# Patient Record
Sex: Male | Born: 1945
Health system: Southern US, Community
[De-identification: ages and names within clinical notes are randomized; demographics above are authoritative.]

## PROBLEM LIST (undated history)

## (undated) DIAGNOSIS — Z83719 Family history of colon polyps, unspecified: Secondary | ICD-10-CM

## (undated) DIAGNOSIS — I440 Atrioventricular block, first degree: Secondary | ICD-10-CM

## (undated) DIAGNOSIS — R7301 Impaired fasting glucose: Secondary | ICD-10-CM

## (undated) DIAGNOSIS — Z8371 Family history of colonic polyps: Secondary | ICD-10-CM

## (undated) DIAGNOSIS — E785 Hyperlipidemia, unspecified: Secondary | ICD-10-CM

## (undated) DIAGNOSIS — I10 Essential (primary) hypertension: Secondary | ICD-10-CM

## (undated) HISTORY — DX: Impaired fasting glucose: R73.01

## (undated) HISTORY — DX: Family history of colon polyps, unspecified: Z83.719

## (undated) HISTORY — DX: Atrioventricular block, first degree: I44.0

## (undated) HISTORY — DX: Family history of colonic polyps: Z83.71

## (undated) HISTORY — DX: Essential (primary) hypertension: I10

## (undated) HISTORY — DX: Hyperlipidemia, unspecified: E78.5

---

## 1998-05-14 HISTORY — PX: OTHER SURGICAL HISTORY: SHX169

## 2000-05-14 HISTORY — PX: COLONOSCOPY: SHX174

## 2008-03-02 ENCOUNTER — Ambulatory Visit: Payer: Self-pay | Admitting: Family Medicine

## 2008-03-02 DIAGNOSIS — M109 Gout, unspecified: Secondary | ICD-10-CM | POA: Insufficient documentation

## 2008-03-02 DIAGNOSIS — I10 Essential (primary) hypertension: Secondary | ICD-10-CM

## 2008-03-02 DIAGNOSIS — E785 Hyperlipidemia, unspecified: Secondary | ICD-10-CM

## 2008-03-04 ENCOUNTER — Encounter: Payer: Self-pay | Admitting: Family Medicine

## 2008-03-05 ENCOUNTER — Encounter: Payer: Self-pay | Admitting: Family Medicine

## 2008-03-05 LAB — CONVERTED CEMR LAB
Albumin: 4.7 g/dL (ref 3.5–5.2)
BUN: 12 mg/dL (ref 6–23)
CO2: 25 meq/L (ref 19–32)
Calcium: 9.5 mg/dL (ref 8.4–10.5)
Chloride: 102 meq/L (ref 96–112)
Cholesterol: 213 mg/dL — ABNORMAL HIGH (ref 0–200)
Creatinine, Ser: 0.84 mg/dL (ref 0.40–1.50)
Glucose, Bld: 116 mg/dL — ABNORMAL HIGH (ref 70–99)
HDL: 44 mg/dL (ref >39)
Potassium: 5.4 meq/L — ABNORMAL HIGH (ref 3.5–5.3)
Total CHOL/HDL Ratio: 4.8

## 2008-11-22 ENCOUNTER — Telehealth (INDEPENDENT_AMBULATORY_CARE_PROVIDER_SITE_OTHER): Payer: Self-pay | Admitting: *Deleted

## 2008-11-23 ENCOUNTER — Ambulatory Visit: Payer: Self-pay | Admitting: Family Medicine

## 2009-02-13 ENCOUNTER — Telehealth (INDEPENDENT_AMBULATORY_CARE_PROVIDER_SITE_OTHER): Payer: Self-pay | Admitting: *Deleted

## 2009-02-13 ENCOUNTER — Ambulatory Visit: Payer: Self-pay | Admitting: Family Medicine

## 2009-02-13 DIAGNOSIS — B029 Zoster without complications: Secondary | ICD-10-CM | POA: Insufficient documentation

## 2009-03-04 ENCOUNTER — Telehealth: Payer: Self-pay | Admitting: Family Medicine

## 2009-03-07 ENCOUNTER — Ambulatory Visit: Payer: Self-pay | Admitting: Family Medicine

## 2009-03-11 ENCOUNTER — Telehealth: Payer: Self-pay | Admitting: Family Medicine

## 2009-07-21 ENCOUNTER — Ambulatory Visit: Payer: Self-pay | Admitting: Family Medicine

## 2009-07-21 DIAGNOSIS — I440 Atrioventricular block, first degree: Secondary | ICD-10-CM

## 2009-07-21 DIAGNOSIS — D126 Benign neoplasm of colon, unspecified: Secondary | ICD-10-CM | POA: Insufficient documentation

## 2009-07-25 ENCOUNTER — Encounter: Payer: Self-pay | Admitting: Family Medicine

## 2009-07-27 LAB — CONVERTED CEMR LAB
ALT: 19 units/L (ref 0–53)
CO2: 27 meq/L (ref 19–32)
Calcium: 9.3 mg/dL (ref 8.4–10.5)
Chloride: 101 meq/L (ref 96–112)
Cholesterol: 188 mg/dL (ref 0–200)
Creatinine, Ser: 0.97 mg/dL (ref 0.40–1.50)
Glucose, Bld: 122 mg/dL — ABNORMAL HIGH (ref 70–99)
Total Bilirubin: 0.5 mg/dL (ref 0.3–1.2)
Total CHOL/HDL Ratio: 4.5
Total Protein: 6.9 g/dL (ref 6.0–8.3)
Triglycerides: 167 mg/dL — ABNORMAL HIGH (ref <150)
VLDL: 33 mg/dL (ref 0–40)

## 2009-08-04 ENCOUNTER — Encounter: Payer: Self-pay | Admitting: Family Medicine

## 2009-08-10 ENCOUNTER — Ambulatory Visit: Payer: Self-pay | Admitting: Family Medicine

## 2009-08-10 DIAGNOSIS — E1169 Type 2 diabetes mellitus with other specified complication: Secondary | ICD-10-CM | POA: Insufficient documentation

## 2009-08-10 DIAGNOSIS — E669 Obesity, unspecified: Secondary | ICD-10-CM

## 2009-08-10 LAB — CONVERTED CEMR LAB
Bilirubin Urine: NEGATIVE
Blood Glucose, AC Bkfst: 122 mg/dL
Blood in Urine, dipstick: NEGATIVE
Glucose, Urine, Semiquant: NEGATIVE
Protein, U semiquant: NEGATIVE
Urobilinogen, UA: 0.2
pH: 6.5

## 2009-08-24 ENCOUNTER — Ambulatory Visit: Payer: Self-pay | Admitting: Cardiology

## 2009-08-24 ENCOUNTER — Encounter: Payer: Self-pay | Admitting: Cardiology

## 2010-02-03 ENCOUNTER — Encounter (INDEPENDENT_AMBULATORY_CARE_PROVIDER_SITE_OTHER): Payer: Self-pay | Admitting: *Deleted

## 2010-06-13 ENCOUNTER — Ambulatory Visit: Admit: 2010-06-13 | Payer: Self-pay | Admitting: Family Medicine

## 2010-06-13 NOTE — Miscellaneous (Signed)
Summary: colonoscopy: polyp/ diverticulosis  Clinical Lists Changes  Observations: Added new observation of COLONRECACT: Further recommendations pending biopsy results.  Repeat colonoscopy in 5 years.  (08/04/2009 13:19) Added new observation of COLONOSCOPY: Location:  [facility].    Polyp in the sigmoid colon  diverticulosis, diffuse otherwise normal to terminal illeum  (08/04/2009 13:19)      Colonoscopy  Procedure date:  08/04/2009  Findings:      Location:  [facility].    Polyp in the sigmoid colon  diverticulosis, diffuse otherwise normal to terminal illeum   Comments:      Further recommendations pending biopsy results.  Repeat colonoscopy in 5 years.    Colonoscopy  Procedure date:  08/04/2009  Findings:      Location:  [facility].    Polyp in the sigmoid colon  diverticulosis, diffuse otherwise normal to terminal illeum   Comments:      Further recommendations pending biopsy results.  Repeat colonoscopy in 5 years.

## 2010-06-13 NOTE — Assessment & Plan Note (Signed)
Summary: fasting sugar and UA//mpm  Nurse Visit   Vitals Entered By: Payton Spark CMA (August 10, 2009 8:36 AM)  Allergies: 1)  ! Allopurinol (Allopurinol) Laboratory Results   Urine Tests    Routine Urinalysis   Color: yellow Appearance: Clear Glucose: negative   (Normal Range: Negative) Bilirubin: negative   (Normal Range: Negative) Ketone: negative   (Normal Range: Negative) Spec. Gravity: 1.015   (Normal Range: 1.003-1.035) Blood: negative   (Normal Range: Negative) pH: 6.5   (Normal Range: 5.0-8.0) Protein: negative   (Normal Range: Negative) Urobilinogen: 0.2   (Normal Range: 0-1) Nitrite: negative   (Normal Range: Negative) Leukocyte Esterace: trace   (Normal Range: Negative)     Blood Tests     CBG Fasting:: 122mg /dL     Orders Added: 1)  Glucose, (CBG) [82962] 2)  UA Dipstick w/o Micro (automated)  [81003]

## 2010-06-13 NOTE — Letter (Signed)
Summary: Letter Regarding Path Results/[facility]  Letter Regarding Path Results/[facility]   Imported By: Lanelle Bal 08/18/2009 11:25:45  _____________________________________________________________________  External Attachment:    Type:   Image     Comment:   External Document

## 2010-06-13 NOTE — Assessment & Plan Note (Signed)
Summary: Welcome to Medicare   Vital Signs:  Patient profile:   65 year old male Height:      71.75 inches Weight:      240 pounds BMI:     32.90 O2 Sat:      99 % on Room air Pulse rate:   69 / minute BP sitting:   138 / 87  (left arm) Cuff size:   large  Vitals Entered By: Payton Spark CMA (July 21, 2009 1:23 PM)  O2 Flow:  Room air CC: Welcome to Medicare CPE  Vision Screening:Left eye with correction: 20 / 70 Right eye with correction: 20 / 20 Both eyes with correction: 20 / 20        Vision Entered By: Payton Spark CMA (July 21, 2009 2:25 PM)   Primary Care Provider:  Seymour Bars DO  CC:  Welcome to Medicare CPE.  History of Present Illness: 65 yo WM presents for Welcome to Medicare Physical.    Medicare Initial Preventive Physical Examination Encounter form completed along with EKG. See scanned document.  Current Medications (verified): 1)  Lisinopril 40 Mg Tabs (Lisinopril) .Marland Kitchen.. 1 Tab By Mouth Once Daily 2)  Simvastatin 20 Mg Tabs (Simvastatin) .... Take 1 Tablet By Mouth Once A Day 3)  Hydrochlorothiazide 25 Mg Tabs (Hydrochlorothiazide) .Marland Kitchen.. 1 Tab By Mouth Daily  Allergies (verified): 1)  ! Allopurinol (Allopurinol)  Past History:  Past Medical History: gout HTN dyslipidemia IFG  colonoscopy 2002, polyps  Past Surgical History: Reviewed history from 03/02/2008 and no changes required. tophi removed foot 2000  Family History: Reviewed history from 03/02/2008 and no changes required. 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 Quit smoking in 1990's after 20 pack year hx. 2 ETOH/ wk No regular exercise.  Review of Systems  The patient denies anorexia, fever, weight loss, weight gain, vision loss, decreased hearing, hoarseness, chest pain, syncope, dyspnea on  exertion, peripheral edema, prolonged cough, headaches, hemoptysis, abdominal pain, melena, hematochezia, severe indigestion/heartburn, hematuria, incontinence, genital sores, muscle weakness, suspicious skin lesions, transient blindness, difficulty walking, depression, unusual weight change, abnormal bleeding, enlarged lymph nodes, angioedema, breast masses, and testicular masses.    Physical Exam  General:  alert, well-developed, well-nourished, and well-hydrated.   Head:  normocephalic and atraumatic.   Eyes:  pupils equal, pupils round, and pupils reactive to light.   Ears:  EACs patent; TMs translucent and gray with good cone of light and bony landmarks.  Nose:  no nasal discharge.   Mouth:  good dentition and pharynx pink and moist.   Neck:  no masses.  no audible carotid bruits Lungs:  Normal respiratory effort, chest expands symmetrically. Lungs are clear to auscultation, no crackles or wheezes. Heart:  Normal rate and regular rhythm. S1 and S2 normal without gallop, murmur, click, rub or other extra sounds. Abdomen:  soft, non-tender, normal bowel sounds, no distention, no masses, no guarding, no hepatomegaly, and no splenomegaly.  no audible aortic bruits Rectal:  No external abnormalities noted. Normal sphincter tone. No rectal masses or tenderness. hemoccult neg Prostate:  Prostate gland firm and smooth, no enlargement, nodularity, tenderness, mass, asymmetry or induration. Msk:  No deformity or scoliosis noted of thoracic or lumbar spine.   Pulses:  R and L carotid,radial,femoral,dorsalis pedis and posterior tibial pulses are full and equal bilaterally Extremities:  No clubbing, cyanosis, edema,  or deformity noted with normal full range of motion of all joints.   Neurologic:  alert & oriented X3 and gait normal.   Skin:  color normal and no rashes.   Cervical Nodes:  No lymphadenopathy noted Psych:  good eye contact, not anxious appearing, and not depressed appearing.      Impression & Recommendations:  Problem # 1:  HEALTH MAINTENANCE EXAM (ICD-V70.0)  Welcome to Medicare Physical done. Form completed. EKG done showing a 1st deg AV block with PR interval of 216 msec and incomplete L BBB. Refer to cardiology for further evaluation. RFd meds. Update fasting labs. Pneumovax given today. GI referral for screening colonoscopy.  Orders: First annual wellness visit with prevention plan  [phone])  Complete Medication List: 1)  Lisinopril 40 Mg Tabs (Lisinopril) .Marland Kitchen.. 1 tab by mouth once daily 2)  Simvastatin 20 Mg Tabs (Simvastatin) .... Take 1 tablet by mouth once a day 3)  Hydrochlorothiazide 25 Mg Tabs (Hydrochlorothiazide) .Marland Kitchen.. 1 tab by mouth daily 4)  Aspir-low 81 Mg Tbec (Aspirin) .Marland Kitchen.. 1 tab by mouth daily  Other Orders: Cardiology Referral (Cardiology) T-PSA Total (Medicare Screen Only) [phone]) T-Comprehensive Metabolic Panel [phone]) T-Lipid Profile [phone]) Prescription Created Electronically [phone]) EKG w/ Interpretation (93000) Gastroenterology Referral (GI) Pneumococcal Vaccine (28413) Admin 1st Vaccine (24401) Admin 1st Vaccine [facility]) [phone])  Patient Instructions: 1)  Pneumovax today. 2)  Will set you up with [facility] cardiology downstairs for abnormal EKG. 3)  Meds RFd for 90 day supply. 4)  Have fasting labs done. 5)  Will call you w/ results. 6)  Will set up your screening colonoscopy at [facility]. 7)  Return for f/u HTN in 6 mos. Prescriptions: HYDROCHLOROTHIAZIDE 25 MG TABS (HYDROCHLOROTHIAZIDE) 1 tab by mouth daily  #90 x 3   Entered and Authorized by:   Seymour Bars DO   Signed by:   Seymour Bars DO on 07/21/2009   Method used:   Electronically to        [facility] [address] [place].* (retail)       [address] [place].       [place], [place]  [address]       Ph: [phone]       Fax: [phone]   RxID:   [id number] SIMVASTATIN 20 MG TABS (SIMVASTATIN) Take 1 tablet by mouth  once a day  #90 x 3   Entered and Authorized by:   Seymour Bars DO   Signed by:   Seymour Bars DO on 07/21/2009   Method used:   Electronically to        [facility] [address] [place].* (retail)       [address] [place].       [place], [place]  [address]       Ph: [phone]       Fax: [phone]   RxID:   [id number] LISINOPRIL 40 MG TABS (LISINOPRIL) 1 tab by mouth once daily  #90 x 3   Entered and Authorized by:   Seymour Bars DO   Signed by:   Seymour Bars DO on 07/21/2009   Method used:   Electronically to        [facility] [address] [place].* (retail)       [address] [place].       [place], [place]  [address]       Ph: [phone]       Fax: [phone]   RxID:   [id number]    Tetanus/Td Immunization History:  Tetanus/Td # 1:  Historical (05/14/2005)  Pneumovax Vaccine    Vaccine Type: Pneumovax (Medicare)    Site: left deltoid    Dose: 0.5 ml    Route: IM    Given by: Payton Spark CMA    Exp. Date: 06/08/2010    Lot #: [id number]    VIS given: 12/10/95 version given July 21, 2009.

## 2010-06-13 NOTE — Miscellaneous (Signed)
Summary: Immunization Entry   Immunization History:  Influenza Immunization History:    Influenza:  historical (02/02/2010) 

## 2010-06-13 NOTE — Assessment & Plan Note (Signed)
Summary: [facility]   Visit Type:  Initial Consult Primary Provider:  Seymour Bars DO  CC:  Av block first degree.  History of Present Illness: 65 year old male for evaluation of abnormal electrocardiogram. The patient was recently seen for a Medicare physical and was noted to have a first degree AV block on his electrocardiogram. Cardiology is therefore asked to further evaluate. He denies any dyspnea on exertion, orthopnea, PND, pedal edema, palpitations, syncope, presyncope or chest pain. There is no history of claudication. Note he exercises routinely. He walks or runs on a treadmill for 15 minutes prior to lifting weights and then 15 minutes afterwards. He has no symptoms with this.  Current Medications (verified): 1)  Lisinopril 40 Mg Tabs (Lisinopril) .Marland Kitchen.. 1 Tab By Mouth Once Daily 2)  Simvastatin 20 Mg Tabs (Simvastatin) .... Take 1 Tablet By Mouth Once A Day 3)  Hydrochlorothiazide 25 Mg Tabs (Hydrochlorothiazide) .Marland Kitchen.. 1 Tab By Mouth Daily 4)  Aspir-Low 81 Mg Tbec (Aspirin) .Marland Kitchen.. 1 Tab By Mouth Daily  Allergies: 1)  ! Allopurinol (Allopurinol)  Past History:  Past Medical History: AV BLOCK, 1ST DEGREE (ICD-426.11) ESSENTIAL HYPERTENSION, BENIGN (ICD-401.1) IMPAIRED FASTING GLUCOSE (ICD-790.21) COLONIC POLYPS (ICD-211.3) HERPES ZOSTER (ICD-053.9) DYSLIPIDEMIA (ICD-272.4) GOUT (ICD-274.9)  Past Surgical History: tophi removed foot 2000 colonoscopy 2002, polyps  Family History: Reviewed history from 03/02/2008 and no changes required. 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 Quit smoking in 1998 after 20 pack year hx. 2 ETOH night No regular exercise.  Review of Systems       no fevers or chills, productive cough, hemoptysis, dysphasia, odynophagia, melena,  hematochezia, dysuria, hematuria, rash, seizure activity, orthopnea, PND, pedal edema, claudication. Remaining systems are negative.   Vital Signs:  Patient profile:   65 year old male Height:      71.75 inches Weight:      237.25 pounds BMI:     32.52 Pulse rate:   63 / minute Pulse rhythm:   regular Resp:     18 per minute BP sitting:   138 / 85  (left arm) Cuff size:   large  Vitals Entered By: Vikki Ports (August 24, 2009 4:01 PM)  Physical Exam  General:  Well developed/well nourished in NAD Skin warm/dry Patient not depressed No peripheral clubbing Back-normal HEENT-normal/normal eyelids Neck supple/normal carotid upstroke bilaterally; no bruits; no JVD; no thyromegaly chest - CTA/ normal expansion CV - RRR/normal S1 and S2; no murmurs, rubs or gallops;  PMI nondisplaced Abdomen -NT/ND, no HSM, no mass, + bowel sounds, no bruit 2+ femoral pulses, no bruits Ext-no edema, chords, 2+ DP Neuro-grossly nonfocal     EKG  Procedure date:  07/21/2009  Findings:      Normal sinus rhythm and, first-degree AV block.   Impression & Recommendations:  Problem # 1:  AV BLOCK, 1ST DEGREE (ICD-426.11) Patient is noted to have an abnormal electrocardiogram with a first degree AV block. He is having no cardiac symptoms including no chest pain, shortness of breath or history of syncope. I do not think further cardiac evaluation is indicated. I have instructed him to contact us if he develops symptoms of dizziness or syncope in the future. His updated medication list for this problem includes:    Lisinopril 40 Mg Tabs (Lisinopril) .Marland Kitchen... 1 tab by mouth once daily    Aspir-low  81 Mg Tbec (Aspirin) .Marland Kitchen... 1 tab by mouth daily  Problem # 2:  ESSENTIAL HYPERTENSION, BENIGN (ICD-401.1) His blood pressure is controlled on present medications. Will continue. His updated medication list for this problem includes:    Lisinopril 40 Mg Tabs (Lisinopril) .Marland Kitchen... 1 tab by mouth once daily     Hydrochlorothiazide 25 Mg Tabs (Hydrochlorothiazide) .Marland Kitchen... 1 tab by mouth daily    Aspir-low 81 Mg Tbec (Aspirin) .Marland Kitchen... 1 tab by mouth daily  Problem # 3:  IMPAIRED FASTING GLUCOSE (ICD-790.21) Management per primary care.  Problem # 4:  DYSLIPIDEMIA (ICD-272.4) Continue statin. Lipids and liver monitored by primary care. His updated medication list for this problem includes:    Simvastatin 20 Mg Tabs (Simvastatin) .Marland Kitchen... Take 1 tablet by mouth once a day

## 2010-06-13 NOTE — Letter (Signed)
Summary: Physical Exam Form/[facility]  Physical Exam Form/[facility]   Imported By: Lanelle Bal 07/27/2009 11:41:29  _____________________________________________________________________  External Attachment:    Type:   Image     Comment:   External Document

## 2010-06-29 ENCOUNTER — Ambulatory Visit (INDEPENDENT_AMBULATORY_CARE_PROVIDER_SITE_OTHER): Payer: Self-pay | Admitting: Family Medicine

## 2010-06-29 ENCOUNTER — Encounter: Payer: Self-pay | Admitting: Family Medicine

## 2010-06-29 DIAGNOSIS — I440 Atrioventricular block, first degree: Secondary | ICD-10-CM

## 2010-06-29 DIAGNOSIS — I10 Essential (primary) hypertension: Secondary | ICD-10-CM

## 2010-06-29 DIAGNOSIS — R7301 Impaired fasting glucose: Secondary | ICD-10-CM

## 2010-06-29 DIAGNOSIS — E785 Hyperlipidemia, unspecified: Secondary | ICD-10-CM

## 2010-07-05 NOTE — Assessment & Plan Note (Signed)
Summary: f/u HTN   Vital Signs:  Patient profile:   65 year old male Height:      71.75 inches Weight:      240 pounds BMI:     32.90 O2 Sat:      96 % on Room air Pulse rate:   86 / minute BP sitting:   132 / 80  (left arm) Cuff size:   large  Vitals Entered By: Payton Spark CMA (June 29, 2010 8:18 AM)  O2 Flow:  Room air CC: F/U.    Primary Care Provider:  Seymour Bars DO  CC:  F/U. Marland Kitchen  History of Present Illness: 65 yo WM presents for f/u HTN and hyperlipidemia.  He was last seen March 2011 for his welcome to Medicare Physical and had all of his fasting labs done.  He is doing well on Lisinopril, HCTZ and Simvastatin.  Denies CP or DOE or heart palpitations or swelling.  He saw Dr Jens Som for his 1st degree AV block but since he has been asymptomatic, has just been told to watch.    Weight has been stable. He does have IFG.  Has not made any changes to diet or exercise.      Current Medications (verified): 1)  Lisinopril 40 Mg Tabs (Lisinopril) .Marland Kitchen.. 1 Tab By Mouth Once Daily 2)  Simvastatin 20 Mg Tabs (Simvastatin) .... Take 1 Tablet By Mouth Once A Day 3)  Hydrochlorothiazide 25 Mg Tabs (Hydrochlorothiazide) .Marland Kitchen.. 1 Tab By Mouth Daily 4)  Aspir-Low 81 Mg Tbec (Aspirin) .Marland Kitchen.. 1 Tab By Mouth Daily  Allergies (verified): 1)  ! Allopurinol (Allopurinol)  Past History:  Past Medical History: Reviewed history from 08/24/2009 and no changes required. AV BLOCK, 1ST DEGREE (ICD-426.11) ESSENTIAL HYPERTENSION, BENIGN (ICD-401.1) IMPAIRED FASTING GLUCOSE (ICD-790.21) COLONIC POLYPS (ICD-211.3) HERPES ZOSTER (ICD-053.9) DYSLIPIDEMIA (ICD-272.4) GOUT (ICD-274.9)  Past Surgical History: Reviewed history from 08/24/2009 and no changes required. 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 2 ETOH night No regular exercise.  Review of Systems      See HPI  Physical Exam  General:  alert, well-developed, well-nourished, and well-hydrated.   Head:  normocephalic and atraumatic.   Eyes:  pupils equal, pupils round, and pupils reactive to light.   Mouth:  pharynx pink and moist.   Lungs:  normal respiratory effort, no intercostal retractions, and no accessory muscle use.   Heart:  normal rate, regular rhythm, and no murmur.   Extremities:  no LE edema Skin:  color normal.   Psych:  good eye contact, not anxious appearing, and not depressed appearing.     Impression & Recommendations:  Problem # 1:  ESSENTIAL HYPERTENSION, BENIGN (ICD-401.1) BP at goal.  Continue current meds and RFd.  Fasting labs are UTD. His updated medication list for this problem includes:    Lisinopril 40 Mg Tabs (Lisinopril) .Marland Kitchen... 1 tab by mouth once daily    Hydrochlorothiazide 25 Mg Tabs (Hydrochlorothiazide) .Marland Kitchen... 1 tab by mouth daily  BP today: 132/80 Prior BP: 138/85 (08/24/2009)  Labs Reviewed: K+: 4.4 (07/25/2009) Creat: : 0.97 (07/25/2009)   Chol: 188 (07/25/2009)   HDL: 42 (07/25/2009)   LDL: 113 (07/25/2009)   TG: 167 (07/25/2009)  Problem # 2:  IMPAIRED FASTING GLUCOSE (ICD-790.21) Fasting sugar 122-->109 today.  He is working on diet and exercise.   Orders: Fingerstick [phone])  Glucose, (CBG) [phone])  Labs Reviewed: Creat: 0.97 (07/25/2009)     Problem # 3:  DYSLIPIDEMIA (ICD-272.4) Continue current meds. His updated medication list for this problem includes:    Simvastatin 20 Mg Tabs (Simvastatin) .Marland Kitchen... Take 1 tablet by mouth once a day  Labs Reviewed: SGOT: 18 (07/25/2009)   SGPT: 19 (07/25/2009)   HDL:42 (07/25/2009), 44 (03/04/2008)  LDL:113 (07/25/2009), 94 (98/03/9146)  Chol:188 (07/25/2009), 213 (03/04/2008)  Trig:167 (07/25/2009), 373 (03/04/2008)  Problem # 4:  AV BLOCK, 1ST DEGREE (ICD-426.11) Saw Cards in 2011, asymptomatic.  No intervention  needed.  Complete Medication List: 1)  Lisinopril 40 Mg Tabs (Lisinopril) .Marland Kitchen.. 1 tab by mouth once daily 2)  Simvastatin 20 Mg Tabs (Simvastatin) .... Take 1 tablet by mouth once a day 3)  Hydrochlorothiazide 25 Mg Tabs (Hydrochlorothiazide) .Marland Kitchen.. 1 tab by mouth daily 4)  Aspir-low 81 Mg Tbec (Aspirin) .Marland Kitchen.. 1 tab by mouth daily  Patient Instructions: 1)  Return for a PHYSICAL in 6 mos. 2)  Fasting sugar 109 < -- 122 Prescriptions: HYDROCHLOROTHIAZIDE 25 MG TABS (HYDROCHLOROTHIAZIDE) 1 tab by mouth daily  #90 x 3   Entered and Authorized by:   Seymour Bars DO   Signed by:   Seymour Bars DO on 06/29/2010   Method used:   Electronically to        [facility] [address] [place].* (retail)       [address] [place].       [place], [place]  [address]       Ph: [phone]       Fax: [phone]   RxID:   [id number] SIMVASTATIN 20 MG TABS (SIMVASTATIN) Take 1 tablet by mouth once a day  #90 Tablet x 3   Entered and Authorized by:   Seymour Bars DO   Signed by:   Seymour Bars DO on 06/29/2010   Method used:   Electronically to        [facility] [address] [place].* (retail)       [address] [place].       [place], [place]  [address]       Ph: [phone]       Fax: [phone]   RxID:   [id number] LISINOPRIL 40 MG TABS (LISINOPRIL) 1 tab by mouth once daily  #90 x 3   Entered and Authorized by:   Seymour Bars DO   Signed by:   Seymour Bars DO on 06/29/2010   Method used:   Electronically to        [facility] [address] [place].* (retail)       [address] [place].       [place], [place]  [address]       Ph: [phone]       Fax: [phone]   RxID:   [id number]    Orders Added: 1)  Fingerstick [36416] 2)  Glucose, (CBG) [82962] 3)  Est. Patient Level IV [46270]    Laboratory Results   Blood Tests     CBG Fasting:: 109mg /dL

## 2010-12-26 ENCOUNTER — Encounter: Payer: Self-pay | Admitting: Cardiology

## 2011-08-16 ENCOUNTER — Encounter: Payer: Self-pay | Admitting: Family Medicine

## 2011-08-16 ENCOUNTER — Ambulatory Visit (INDEPENDENT_AMBULATORY_CARE_PROVIDER_SITE_OTHER): Payer: Medicare Other | Admitting: Family Medicine

## 2011-08-16 VITALS — BP 139/80 | HR 93 | Wt 245.0 lb

## 2011-08-16 DIAGNOSIS — Z9181 History of falling: Secondary | ICD-10-CM

## 2011-08-16 DIAGNOSIS — E785 Hyperlipidemia, unspecified: Secondary | ICD-10-CM

## 2011-08-16 DIAGNOSIS — I10 Essential (primary) hypertension: Secondary | ICD-10-CM

## 2011-08-16 DIAGNOSIS — E669 Obesity, unspecified: Secondary | ICD-10-CM

## 2011-08-16 DIAGNOSIS — Z1331 Encounter for screening for depression: Secondary | ICD-10-CM

## 2011-08-16 MED ORDER — AMBULATORY NON FORMULARY MEDICATION: Status: DC

## 2011-08-16 MED ORDER — LISINOPRIL 40 MG PO TABS: 40.0000 mg | ORAL_TABLET | Freq: Every day | ORAL | Status: DC

## 2011-08-16 MED ORDER — HYDROCHLOROTHIAZIDE 25 MG PO TABS: 25.0000 mg | ORAL_TABLET | Freq: Every day | ORAL | Status: DC

## 2011-08-16 MED ORDER — SIMVASTATIN 20 MG PO TABS: 20.0000 mg | ORAL_TABLET | Freq: Every day | ORAL | Status: DC

## 2011-08-16 NOTE — Patient Instructions (Signed)

## 2011-08-16 NOTE — Progress Notes (Signed)
  Subjective:    Patient ID: Aaron Horn, male    DOB: [birth year], 66 y.o.   MRN: [id number]  Hypertension This is a chronic problem. The current episode started more than 1 year ago. The problem is controlled. Associated symptoms include headaches. Pertinent negatives include no chest pain, palpitations, peripheral edema or shortness of breath. There are no associated agents to hypertension. Risk factors for coronary artery disease include male gender, sedentary lifestyle and obesity. Past treatments include ACE inhibitors. Compliance problems include exercise.   Hyperlipidemia This is a chronic problem. The current episode started more than 1 year ago. The problem is controlled. Exacerbating diseases include obesity. Pertinent negatives include no chest pain or shortness of breath. Current antihyperlipidemic treatment includes statins. Compliance problems include adherence to exercise.       Review of Systems  Respiratory: Negative for shortness of breath.   Cardiovascular: Negative for chest pain and palpitations.  Neurological: Positive for headaches.       Objective:   Physical Exam  Constitutional: He is oriented to person, place, and time. He appears well-developed and well-nourished.  HENT:  Head: Normocephalic and atraumatic.  Cardiovascular: Normal rate, regular rhythm and normal heart sounds.        No carotid bruits.   Pulmonary/Chest: Effort normal and breath sounds normal.  Musculoskeletal: He exhibits no edema.  Neurological: He is alert and oriented to person, place, and time.  Skin: Skin is warm and dry.  Psychiatric: He has a normal mood and affect. His behavior is normal.          Assessment & Plan:  HTN- well controlled. He says it usually runs a little bit lower on closer to 1:30. Doing well. Follow up in 6 months. He is due for CMP and lipid panel. Lab slip given today. Next  Hyperlipidemia-refill his medication today. We'll recheck his labs to make  sure he is at goal as it has been over a year.  We discussed the importance of getting the shingles vaccine. He is alert he has shingles as if he is interested in getting the vaccine. Prescription given.  Obesity-we discussed the importance of getting back into regular exercise routine and working on his weight. He agrees.  Fall risk screen-Score of 4, which is low risk.   Depression screening PHQ-9 score of 0. Neg for depression.

## 2011-08-17 LAB — COMPLETE METABOLIC PANEL WITH GFR
ALT: 24 U/L (ref 0–53)
AST: 17 U/L (ref 0–37)
Albumin: 4.5 g/dL (ref 3.5–5.2)
Alkaline Phosphatase: 48 U/L (ref 39–117)
BUN: 13 mg/dL (ref 6–23)
Potassium: 4.9 mEq/L (ref 3.5–5.3)
Sodium: 141 mEq/L (ref 135–145)

## 2011-08-17 LAB — LIPID PANEL
Cholesterol: 185 mg/dL (ref 0–200)
Total CHOL/HDL Ratio: 3.6 Ratio
VLDL: 41 mg/dL — ABNORMAL HIGH (ref 0–40)

## 2012-02-06 ENCOUNTER — Ambulatory Visit (INDEPENDENT_AMBULATORY_CARE_PROVIDER_SITE_OTHER): Payer: Medicare Other | Admitting: Family Medicine

## 2012-02-06 ENCOUNTER — Encounter: Payer: Self-pay | Admitting: Family Medicine

## 2012-02-06 VITALS — BP 125/82 | HR 84 | Wt 248.0 lb

## 2012-02-06 DIAGNOSIS — R7309 Other abnormal glucose: Secondary | ICD-10-CM

## 2012-02-06 DIAGNOSIS — I10 Essential (primary) hypertension: Secondary | ICD-10-CM

## 2012-02-06 DIAGNOSIS — E785 Hyperlipidemia, unspecified: Secondary | ICD-10-CM

## 2012-02-06 DIAGNOSIS — L219 Seborrheic dermatitis, unspecified: Secondary | ICD-10-CM

## 2012-02-06 DIAGNOSIS — Z23 Encounter for immunization: Secondary | ICD-10-CM

## 2012-02-06 DIAGNOSIS — L821 Other seborrheic keratosis: Secondary | ICD-10-CM

## 2012-02-06 LAB — BASIC METABOLIC PANEL WITH GFR
BUN: 15 mg/dL (ref 6–23)
Creat: 0.9 mg/dL (ref 0.50–1.35)
GFR, Est African American: >89 mL/min
GFR, Est Non African American: 88 mL/min
Potassium: 4.9 mEq/L (ref 3.5–5.3)

## 2012-02-06 LAB — HEMOGLOBIN A1C: Mean Plasma Glucose: 134 mg/dL — ABNORMAL HIGH (ref <117)

## 2012-02-06 NOTE — Progress Notes (Signed)
  Subjective:    Patient ID: Aaron Horn, male    DOB: [birth year], 66 y.o.   MRN: [id number]  HPI Here for six-month followup for blood pressure and high cholesterol. We did do blood work. Triglyerides are still elevated.  HTN - No CP or SOB.  Taking meds without difficulty. No headaches or dizziness or palpitations.  Hyperlipidemia - Taking 3 fish oil a day. (480 a piece).  Tolerating meds well with no SE.    He has several moles that he would like me to take a look at today to see if he feels the pain to refer him to a dermatologist. He says they occasionally get a little irritated but otherwise are not very bothersome. They have been there for several years.   Review of Systems     Objective:   Physical Exam  Constitutional: He is oriented to person, place, and time. He appears well-developed and well-nourished.  HENT:  Head: Normocephalic and atraumatic.  Cardiovascular: Normal rate, regular rhythm and normal heart sounds.   Pulmonary/Chest: Effort normal and breath sounds normal.  Neurological: He is alert and oriented to person, place, and time.  Skin: Skin is warm and dry.  Psychiatric: He has a normal mood and affect. His behavior is normal.          Assessment & Plan:  Hypertension-well-controlled today. Continue current regimen. Followup in 6 months.  Hyperlipidemia-need to repeat triglycerides and elevated in April. He is taking 3 for official tablets daily so hopefully this will help.  Abnormal glucose - we'll check a hemoglobin A1c since his last 2 glucose checks were abnormal. He has no prior history diabetes but says that his sugars have been borderline for years. Continue work on diet and exercise. He admits is not effective if it could be.  Seborrheic keratoses - discussed treatment options. These are very itchy and are benign conditions. Patient opted have cryotherapy to remove them. Call if having any problems are not healing well.  Cryotherapy  Procedure Note  Pre-operative Diagnosis: Seborrheic keratosis  Post-operative Diagnosis: same  Locations: Upper back, right side, right thigh and anterior neck   Indications: irritation  Anesthesia: not required   Procedure Details  History of allergy to iodine: no. Pacemaker? no.  Patient informed of risks (permanent scarring, infection, light or dark discoloration, bleeding, infection, weakness, numbness and recurrence of the lesion) and benefits of the procedure and verbal informed consent obtained.  The areas are treated with liquid nitrogen therapy, frozen until ice ball extended 1 mm beyond lesion, allowed to thaw, and treated again. The patient tolerated procedure well.  The patient was instructed on post-op care, warned that there may be blister formation, redness and pain. Recommend OTC analgesia as needed for pain.  Condition: Stable  Complications: none.  Plan: 1. Instructed to keep the area dry and covered for 24-48h and clean thereafter. 2. Warning signs of infection were reviewed.   3. Recommended that the patient use OTC acetaminophen as needed for pain.  4. Return prn. Call if any problems with wound healing.

## 2012-02-15 ENCOUNTER — Other Ambulatory Visit: Payer: Self-pay | Admitting: Family Medicine

## 2012-03-07 ENCOUNTER — Telehealth: Payer: Self-pay | Admitting: Physician Assistant

## 2012-03-07 NOTE — Telephone Encounter (Signed)
Pt.notified

## 2012-03-07 NOTE — Telephone Encounter (Signed)
Call pt: Let him know we don't have any documentation of regular eye exam. Recommend yearly screening at his age for glaucoma/cataracts and other eye conditions.

## 2012-08-12 ENCOUNTER — Ambulatory Visit (INDEPENDENT_AMBULATORY_CARE_PROVIDER_SITE_OTHER): Payer: Medicare Other | Admitting: Family Medicine

## 2012-08-12 ENCOUNTER — Encounter: Payer: Self-pay | Admitting: Family Medicine

## 2012-08-12 ENCOUNTER — Other Ambulatory Visit: Payer: Self-pay | Admitting: Family Medicine

## 2012-08-12 VITALS — BP 133/86 | HR 83 | Ht 71.75 in | Wt 250.0 lb

## 2012-08-12 DIAGNOSIS — R0683 Snoring: Secondary | ICD-10-CM

## 2012-08-12 DIAGNOSIS — E785 Hyperlipidemia, unspecified: Secondary | ICD-10-CM

## 2012-08-12 DIAGNOSIS — R0609 Other forms of dyspnea: Secondary | ICD-10-CM

## 2012-08-12 DIAGNOSIS — I10 Essential (primary) hypertension: Secondary | ICD-10-CM

## 2012-08-12 DIAGNOSIS — R7301 Impaired fasting glucose: Secondary | ICD-10-CM

## 2012-08-12 DIAGNOSIS — Z6834 Body mass index (BMI) 34.0-34.9, adult: Secondary | ICD-10-CM

## 2012-08-12 DIAGNOSIS — L821 Other seborrheic keratosis: Secondary | ICD-10-CM

## 2012-08-12 LAB — COMPLETE METABOLIC PANEL WITH GFR
AST: 28 U/L (ref 0–37)
Albumin: 4.8 g/dL (ref 3.5–5.2)
BUN: 12 mg/dL (ref 6–23)
Calcium: 9.5 mg/dL (ref 8.4–10.5)
Chloride: 101 mEq/L (ref 96–112)
Creat: 0.97 mg/dL (ref 0.50–1.35)
GFR, Est Non African American: 80 mL/min
Glucose, Bld: 143 mg/dL — ABNORMAL HIGH (ref 70–99)
Potassium: 4.5 mEq/L (ref 3.5–5.3)

## 2012-08-12 LAB — LIPID PANEL
Cholesterol: 205 mg/dL — ABNORMAL HIGH (ref 0–200)
Triglycerides: 222 mg/dL — ABNORMAL HIGH (ref <150)
VLDL: 44 mg/dL — ABNORMAL HIGH (ref 0–40)

## 2012-08-12 MED ORDER — LISINOPRIL 40 MG PO TABS: ORAL_TABLET | ORAL | Status: DC

## 2012-08-12 MED ORDER — HYDROCHLOROTHIAZIDE 25 MG PO TABS: 25.0000 mg | ORAL_TABLET | Freq: Every day | ORAL | Status: DC

## 2012-08-12 MED ORDER — SIMVASTATIN 40 MG PO TABS: 40.0000 mg | ORAL_TABLET | Freq: Every day | ORAL | Status: DC

## 2012-08-12 MED ORDER — SIMVASTATIN 20 MG PO TABS: ORAL_TABLET | ORAL | Status: DC

## 2012-08-12 NOTE — Progress Notes (Signed)
Subjective:    Patient ID: Aaron Horn, male    DOB: [birth year], 67 y.o.   MRN: [id number]  HPI HTN -  Pt denies chest pain, SOB, dizziness, or heart palpitations.  Taking meds as directed w/o problems.  Denies medication side effects.    IFG -  last hemoglobin A1c was 6.3 in September. He really doesn't eat a lot of concentrated sweets. No regular exercise.  Hyperlipidemia -  Tolerating his statin well. He does occasionally miss a dose. No myalgias or side effects.  Nose bleeds.  - Started recenlty.  So slowed down on taking his ASA daily.  Says had nosebleeds as a kid.  Worse on the right side. No scabs in the nose that area.  No hx of nasal polyps.   He has several more seborrheic keratoses on his back and one on his left forehead and neck that he would like me to freeze today.   Review of Systems     Objective:   Physical Exam  Constitutional: He is oriented to person, place, and time. He appears well-developed and well-nourished.  HENT:  Head: Normocephalic and atraumatic.  Cardiovascular: Normal rate, regular rhythm and normal heart sounds.   No carotid bruits.   Pulmonary/Chest: Effort normal and breath sounds normal.  Musculoskeletal: He exhibits no edema.  Neurological: He is alert and oriented to person, place, and time.  Skin: Skin is warm and dry.  Psychiatric: He has a normal mood and affect. His behavior is normal.          Assessment & Plan:  HTN- Well controlled. Continue current regimen. Followup in 3 months.  IFG - Now in the diabetic range. Discussed getting back on track with diet, exericse and weight loss. Offered to send to diabetic and nutrition classes. Really work on cutting back on carbohydrates since he does a good job on low sugar foods. Followup in 3 months for repeat hemoglobin A1c.  Lab Results  Component Value Date   HGBA1C 6.5 08/12/2012    Hyperlipidemia - tolerating statin well. Due to recheck lipids. Lab slip given  today.  Nosebleeds - Use bactrobran ointment BID and use humidifier at night. Hold aspirin for couple of weeks until the nosebleeds resolve.   He says he would like to see ENT for his snoring at some point..  He has never been formally tested for sleep apnea. He says he is not ready for an official referral but just wants her contact information today.  Obesity-he's also interested in weight loss. He used to take Meridia and did well with it. He is interested in restarting it again. That he thought he heard some new warning about it. He asked me to check into it.  Cryotherapy Procedure Note  Pre-operative Diagnosis: Seborrheic keratoses  Post-operative Diagnosis: same  Locations: Bac, neck, left forehead near hair liine   , 10 lesions frozen.   Indications: Irritated.   Anesthesia: not required    Procedure Details  Her her Patient informed of risks (permanent scarring, infection, light or dark discoloration, bleeding, infection, weakness, numbness and recurrence of the lesion) and benefits of the procedure and verbal informed consent obtained.  The areas are treated with liquid nitrogen therapy, frozen until ice ball extended 2 mm beyond lesion, allowed to thaw, and treated again. The patient tolerated procedure well.  The patient was instructed on post-op care, warned that there may be blister formation, redness and pain. Recommend OTC analgesia as needed for pain.  Condition: Stable  Complications: none.  Plan: 1. Instructed to keep the area dry and covered for 24-48h and clean thereafter. 2. Warning signs of infection were reviewed.   3. Recommended that the patient use OTC acetaminophen as needed for pain.  4. Return in prn.

## 2012-08-13 ENCOUNTER — Telehealth: Payer: Self-pay | Admitting: Family Medicine

## 2012-08-13 NOTE — Telephone Encounter (Signed)
The patient and let him know that I did check on the Meridia. It actually has been removed from the market. There was evidently some increased risk of heart disease and strokes it has been taken off the market. He can certainly try Alli which is over-the-counter or I would be happy to see him for an office visit to discuss other prescription medication options that are now market.

## 2012-08-13 NOTE — Telephone Encounter (Signed)
Called and told pt that the med that he wanted to know about had been taken off of the market and that Dr. Linford Arnold said that he could try Alli that is an OTC weight loss med or he can make an appt to discuss prescription options. Laureen Ochs, Viann Shove

## 2012-11-24 ENCOUNTER — Ambulatory Visit (INDEPENDENT_AMBULATORY_CARE_PROVIDER_SITE_OTHER): Payer: Medicare Other | Admitting: Family Medicine

## 2012-11-24 ENCOUNTER — Encounter: Payer: Self-pay | Admitting: Family Medicine

## 2012-11-24 VITALS — BP 111/73 | HR 99 | Ht 72.6 in | Wt 243.0 lb

## 2012-11-24 DIAGNOSIS — I10 Essential (primary) hypertension: Secondary | ICD-10-CM

## 2012-11-24 DIAGNOSIS — R7301 Impaired fasting glucose: Secondary | ICD-10-CM

## 2012-11-24 DIAGNOSIS — E785 Hyperlipidemia, unspecified: Secondary | ICD-10-CM

## 2012-11-24 LAB — BASIC METABOLIC PANEL WITH GFR
BUN: 13 mg/dL (ref 6–23)
CO2: 28 mEq/L (ref 19–32)
Chloride: 100 mEq/L (ref 96–112)
Glucose, Bld: 139 mg/dL — ABNORMAL HIGH (ref 70–99)
Potassium: 4.7 mEq/L (ref 3.5–5.3)
Sodium: 138 mEq/L (ref 135–145)

## 2012-11-24 LAB — LIPID PANEL
Cholesterol: 178 mg/dL (ref 0–200)
Total CHOL/HDL Ratio: 4.3 Ratio
Triglycerides: 249 mg/dL — ABNORMAL HIGH (ref <150)
VLDL: 50 mg/dL — ABNORMAL HIGH (ref 0–40)

## 2012-11-24 NOTE — Progress Notes (Signed)
Subjective:    Patient ID: Aaron Horn, male    DOB: February 20, 1946, 67 y.o.   MRN: [id number]  HPI HTN-  Pt denies chest pain, SOB, dizziness, or heart palpitations.  Taking meds as directed w/o problems.  Denies medication side effects. Tolerating lisinopril 40mg  well, we had inc his dose  IFG- welll controlled. A1C was 6.5 last time and decided to work on diet and exercise before medication.   Hyperlipidemia - Did inc fish oil to 4 tabs and tolearigng well. Wants to recheck levels today.    Review of Systems BP 111/73  Pulse 99  Ht 6' 0.6" (1.844 m)  Wt 243 lb (110.224 kg)  BMI 32.42 kg/m2    Allergies  Allergen Reactions  . Allopurinol     REACTION: rash    Past Medical History  Diagnosis Date  . AV block, 1st degree   . HTN (hypertension)     benign essential  . Impaired fasting glucose   . FH: colonic polyps   . Herpes zoster   . Dyslipidemia   . Gout     Past Surgical History  Procedure Laterality Date  . Tohphi removal foot  2000  . Colonoscopy  2002    polyps     History   Social History  . Marital Status: Married    Spouse Name: N/A    Number of Children: N/A  . Years of Education: N/A   Occupational History  . Not on file.   Social History Main Topics  . Smoking status: Former Games developer  . Smokeless tobacco: Not on file     Comment: quit smoking in 1998 after 20 pack yr hx  . Alcohol Use: Yes     Comment: 2 etoh a night   . Drug Use:   . Sexually Active:    Other Topics Concern  . Not on file   Social History Narrative   No regular exercise.     Family History  Problem Relation Age of Onset  . Hypertension Mother   . Esophageal cancer Father     Outpatient Encounter Prescriptions as of 11/24/2012  Medication Sig Dispense Refill  . AMBULATORY NON FORMULARY MEDICATION Medication Name: Zostavax IM x 1  1 vial  0  . aspirin (ASPIR-LOW) 81 MG EC tablet Take 81 mg by mouth daily.        . Cholecalciferol (VITAMIN D3 PO) Take 1  capsule by mouth daily.      . fish oil-omega-3 fatty acids 1000 MG capsule Take 4 mg by mouth daily.       . hydrochlorothiazide (HYDRODIURIL) 25 MG tablet Take 1 tablet (25 mg total) by mouth daily.  90 tablet  1  . lisinopril (PRINIVIL,ZESTRIL) 40 MG tablet TAKE 1 TABLET EVERY DAY  90 tablet  1  . simvastatin (ZOCOR) 40 MG tablet Take 1 tablet (40 mg total) by mouth at bedtime. TAKE 1 TABLET EVERY DAY  90 tablet  1   No facility-administered encounter medications on file as of 11/24/2012.          Objective:   Physical Exam  Constitutional: He is oriented to person, place, and time. He appears well-developed and well-nourished.  HENT:  Head: Normocephalic and atraumatic.  Cardiovascular: Normal rate, regular rhythm and normal heart sounds.   Pulmonary/Chest: Effort normal and breath sounds normal.  Neurological: He is alert and oriented to person, place, and time.  Skin: Skin is warm and dry.  Psychiatric: He has a normal  mood and affect. His behavior is normal.          Assessment & Plan:  IFG - fantastic progress!! Great job. Keep up th egood work. Recheck in 6 months. cpontinue to work on weight loss.   HTN - well controlled.  F/U in 6 months.   Hyperlipidemia - doing well on fish oil.  Recheck TG today.

## 2013-02-03 ENCOUNTER — Other Ambulatory Visit: Payer: Self-pay | Admitting: Family Medicine

## 2013-03-19 ENCOUNTER — Other Ambulatory Visit: Payer: Self-pay

## 2013-08-04 ENCOUNTER — Other Ambulatory Visit: Payer: Self-pay | Admitting: *Deleted

## 2013-08-04 MED ORDER — SIMVASTATIN 40 MG PO TABS: ORAL_TABLET | ORAL | Status: DC

## 2013-08-04 MED ORDER — LISINOPRIL 40 MG PO TABS: ORAL_TABLET | ORAL | Status: DC

## 2013-08-21 ENCOUNTER — Ambulatory Visit (INDEPENDENT_AMBULATORY_CARE_PROVIDER_SITE_OTHER): Payer: Medicare Other | Admitting: Family Medicine

## 2013-08-21 ENCOUNTER — Encounter: Payer: Self-pay | Admitting: Family Medicine

## 2013-08-21 VITALS — BP 123/77 | HR 67 | Ht 72.5 in | Wt 248.0 lb

## 2013-08-21 DIAGNOSIS — H9319 Tinnitus, unspecified ear: Secondary | ICD-10-CM | POA: Insufficient documentation

## 2013-08-21 DIAGNOSIS — Z125 Encounter for screening for malignant neoplasm of prostate: Secondary | ICD-10-CM

## 2013-08-21 DIAGNOSIS — E669 Obesity, unspecified: Secondary | ICD-10-CM | POA: Insufficient documentation

## 2013-08-21 DIAGNOSIS — R7301 Impaired fasting glucose: Secondary | ICD-10-CM

## 2013-08-21 DIAGNOSIS — I10 Essential (primary) hypertension: Secondary | ICD-10-CM

## 2013-08-21 DIAGNOSIS — L821 Other seborrheic keratosis: Secondary | ICD-10-CM

## 2013-08-21 DIAGNOSIS — Z0000 Encounter for general adult medical examination without abnormal findings: Secondary | ICD-10-CM

## 2013-08-21 LAB — COMPLETE METABOLIC PANEL WITH GFR
ALBUMIN: 4.3 g/dL (ref 3.5–5.2)
ALT: 28 U/L (ref 0–53)
AST: 19 U/L (ref 0–37)
Alkaline Phosphatase: 42 U/L (ref 39–117)
BUN: 19 mg/dL (ref 6–23)
CALCIUM: 9.4 mg/dL (ref 8.4–10.5)
CHLORIDE: 102 meq/L (ref 96–112)
CO2: 26 meq/L (ref 19–32)
Creat: 0.9 mg/dL (ref 0.50–1.35)
GFR, Est Non African American: 87 mL/min
Glucose, Bld: 134 mg/dL — ABNORMAL HIGH (ref 70–99)
POTASSIUM: 4.3 meq/L (ref 3.5–5.3)
SODIUM: 138 meq/L (ref 135–145)
TOTAL PROTEIN: 6.5 g/dL (ref 6.0–8.3)
Total Bilirubin: 0.5 mg/dL (ref 0.2–1.2)

## 2013-08-21 LAB — POCT GLYCOSYLATED HEMOGLOBIN (HGB A1C): Hemoglobin A1C: 6.6

## 2013-08-21 MED ORDER — SIMVASTATIN 40 MG PO TABS: ORAL_TABLET | ORAL | Status: DC

## 2013-08-21 MED ORDER — HYDROCHLOROTHIAZIDE 25 MG PO TABS: ORAL_TABLET | ORAL | Status: DC

## 2013-08-21 MED ORDER — LISINOPRIL 40 MG PO TABS: ORAL_TABLET | ORAL | Status: DC

## 2013-08-21 NOTE — Progress Notes (Signed)
Subjective:    Aaron Horn is a 68 y.o. male who presents for Medicare Annual/Subsequent preventive examination.   Preventive Screening-Counseling & Management  Tobacco History  Smoking status  . Former Smoker  Smokeless tobacco  . Not on file    Comment: quit smoking in 1998 after 20 pack yr hx    Problems Prior to Visit 1.   Current Problems (verified) Patient Active Problem List   Diagnosis Date Noted  . IMPAIRED FASTING GLUCOSE 08/10/2009  . COLONIC POLYPS 07/21/2009  . AV BLOCK, 1ST DEGREE 07/21/2009  . HERPES ZOSTER 02/13/2009  . DYSLIPIDEMIA 03/02/2008  . GOUT, ACUTE 03/02/2008  . ESSENTIAL HYPERTENSION, BENIGN 03/02/2008    Medications Prior to Visit Current Outpatient Prescriptions on File Prior to Visit  Medication Sig Dispense Refill  . AMBULATORY NON FORMULARY MEDICATION Medication Name: Zostavax IM x 1  1 vial  0  . aspirin (ASPIR-LOW) 81 MG EC tablet Take 81 mg by mouth daily.        . Cholecalciferol (VITAMIN D3 PO) Take 1 capsule by mouth daily.      . fish oil-omega-3 fatty acids 1000 MG capsule Take 4 mg by mouth daily.       . hydrochlorothiazide (HYDRODIURIL) 25 MG tablet TAKE 1 TABLET EVERY DAY  90 tablet  1  . lisinopril (PRINIVIL,ZESTRIL) 40 MG tablet TAKE 1 TABLET EVERY DAY  30 tablet  0  . simvastatin (ZOCOR) 40 MG tablet TAKE 1 TABLET BY MOUTH EVERY DAY AT BEDTIME  30 tablet  0   No current facility-administered medications on file prior to visit.    Current Medications (verified) Current Outpatient Prescriptions  Medication Sig Dispense Refill  . AMBULATORY NON FORMULARY MEDICATION Medication Name: Zostavax IM x 1  1 vial  0  . aspirin (ASPIR-LOW) 81 MG EC tablet Take 81 mg by mouth daily.        . Cholecalciferol (VITAMIN D3 PO) Take 1 capsule by mouth daily.      . fish oil-omega-3 fatty acids 1000 MG capsule Take 4 mg by mouth daily.       . hydrochlorothiazide (HYDRODIURIL) 25 MG tablet TAKE 1 TABLET EVERY DAY  90 tablet  1  .  lisinopril (PRINIVIL,ZESTRIL) 40 MG tablet TAKE 1 TABLET EVERY DAY  30 tablet  0  . simvastatin (ZOCOR) 40 MG tablet TAKE 1 TABLET BY MOUTH EVERY DAY AT BEDTIME  30 tablet  0   No current facility-administered medications for this visit.     Allergies (verified) Allopurinol   PAST HISTORY  Family History Family History  Problem Relation Age of Onset  . Hypertension Mother   . Esophageal cancer Father     Social History History  Substance Use Topics  . Smoking status: Former Research scientist (life sciences)  . Smokeless tobacco: Not on file     Comment: quit smoking in 1998 after 20 pack yr hx  . Alcohol Use: Yes     Comment: 2 etoh a night     Are there smokers in your home (other than you)?  No  Risk Factors Current exercise habits: The patient does not participate in regular exercise at present.  Dietary issues discussed: None  Cardiac risk factors: advanced age (older than 69 for men, 60 for women), dyslipidemia, hypertension and sedentary lifestyle.  Depression Screen (Note: if answer to either of the following is "Yes", a more complete depression screening is indicated)   Q1: Over the past two weeks, have you felt down, depressed or  hopeless? No  Q2: Over the past two weeks, have you felt little interest or pleasure in doing things? No  Have you lost interest or pleasure in daily life? No  Do you often feel hopeless? No  Do you cry easily over simple problems? No  Activities of Daily Living In your present state of health, do you have any difficulty performing the following activities?:  Driving? No Managing money?  No Feeding yourself? No Getting from bed to chair? No Climbing a flight of stairs? No Preparing food and eating?: No Bathing or showering? No Getting dressed: No Getting to the toilet? No Using the toilet:No Moving around from place to place: No In the past year have you fallen or had a near fall?:No   Are you sexually active?  Yes  Do you have more than one  partner?  No  Hearing Difficulties: Yes Do you often ask people to speak up or repeat themselves? No Do you experience ringing or noises in your ears? Yes Do you have difficulty understanding soft or whispered voices? Yes   Do you feel that you have a problem with memory? No  Do you often misplace items? No  Do you feel safe at home?  Yes  Cognitive Testing  Alert? Yes  Normal Appearance?Yes  Oriented to person? Yes  Place? Yes   Time? Yes  Recall of three objects?  Yes  Can perform simple calculations? Yes  Displays appropriate judgment?Yes  Can read the correct time from a watch face?Yes   Advanced Directives have been discussed with the patient? No   List the Names of Other Physician/Practitioners you currently use: 1.    Indicate any recent Medical Services you may have received from other than [facility] providers in the past year (date may be approximate).  Immunization History  Administered Date(s) Administered  . Influenza Split 02/06/2012  . Influenza Whole 03/02/2008, 02/02/2010  . Pneumococcal Polysaccharide-23 07/21/2009  . Td 05/14/2005    Screening Tests Health Maintenance  Topic Date Due  . Zostavax  03/05/2004  . Influenza Vaccine  12/12/2013  . Tetanus/tdap  05/15/2015  . Colonoscopy  05/15/2019  . Pneumococcal Polysaccharide Vaccine Age 65 And Over  Completed    All answers were reviewed with the patient and necessary referrals were made:  Madalin Hughart, MD   08/21/2013   History reviewed: allergies, current medications, past family history, past medical history, past social history, past surgical history and problem list  Review of Systems A comprehensive review of systems was negative.    Objective:     Vision by Snellen chart: right eye: Declined eye exam secondary to eyes burning today  BP 123/77  Pulse 67  Ht 6' 0.5" (1.842 m)  Wt 248 lb (112.492 kg)  BMI 33.15 kg/m2  General Appearance:    Alert, cooperative, no distress, appears  stated age  Head:    Normocephalic, without obvious abnormality, atraumatic  Eyes:    PERRL, conjunctiva/corneas clear, EOM's intact, both eyes       Ears:    Normal TM's and external ear canals, both ears  Nose:   Nares normal, septum midline, mucosa normal, no drainage    or sinus tenderness  Throat:   Lips, mucosa, and tongue normal; teeth and gums normal  Neck:   Supple, symmetrical, trachea midline, no adenopathy;       thyroid:  No enlargement/tenderness/nodules; no carotid   bruit or JVD  Back:     Symmetric, no curvature, ROM normal, no  CVA tenderness  Lungs:     Clear to auscultation bilaterally, respirations unlabored  Chest wall:    No tenderness or deformity  Heart:    Regular rate and rhythm, S1 and S2 normal, no murmur, rub   or gallop  Abdomen:     Soft, non-tender, bowel sounds active all four quadrants,    no masses, no organomegaly  Genitalia:    Not performed.   Rectal:    Not performed  Extremities:   Extremities normal, atraumatic, no cyanosis or edema  Pulses:   2+ and symmetric all extremities  Skin:   Skin color, texture, turgor normal, no rashes or lesions  Lymph nodes:   Cervical, supraclavicular, and axillary nodes normal  Neurologic:   CNII-XII intact. Normal strength, sensation and reflexes      throughout       Assessment:    annual Medicare Wellness Exam      Plan:     During the course of the visit the patient was educated and counseled about appropriate screening and preventive services including:    Advanced directives: has NO advanced directive - not interested in additional information  due for shinlges vaccine. H.O provided.    Obesity/BMI >30  - Discussed need for diet and exercise.   Recommend yearly eye exam.   Diet review for nutrition referral? Yes ____  Not Indicated _X__   Patient Instructions (the written plan) was given to the patient.  Medicare Attestation I have personally reviewed: The patient's medical and social  history Their use of alcohol, tobacco or illicit drugs Their current medications and supplements The patient's functional ability including ADLs,fall risks, home safety risks, cognitive, and hearing and visual impairment Diet and physical activities Evidence for depression or mood disorders  The patient's weight, height, BMI, and visual acuity have been recorded in the chart.  I have made referrals, counseling, and provided education to the patient based on review of the above and I have provided the patient with a written personalized care plan for preventive services.     Donovan Persley, MD   08/21/2013       Cryotherapy Procedure Note  Pre-operative Diagnosis: Seborrheic keratosis  Post-operative Diagnosis: Same  Locations: right arm adn forehead    Indications: irritated, 4 lesion on forehead and 1 on arm  Anesthesia: not required    Procedure Details  Patient informed of risks (permanent scarring, infection, light or dark discoloration, bleeding, infection, weakness, numbness and recurrence of the lesion) and benefits of the procedure and verbal informed consent obtained.  The areas are treated with liquid nitrogen therapy, frozen until ice ball extended 1 mm beyond lesion, allowed to thaw, and treated again. The patient tolerated procedure well.  The patient was instructed on post-op care, warned that there may be blister formation, redness and pain. Recommend OTC analgesia as needed for pain.  Condition: Stable  Complications: none.  Plan: 1. Instructed to keep the area dry and covered for 24-48h and clean thereafter. 2. Warning signs of infection were reviewed.   3. Recommended that the patient use OTC acetaminophen as needed for pain.  4. Return prn.

## 2013-08-21 NOTE — Patient Instructions (Signed)
Keep up a regular exercise program and make sure you are eating a healthy diet Try to eat 4 servings of dairy a day, or if you are lactose intolerant take a calcium with vitamin D daily.  Your vaccines are up to date.  Recommend yearly eye exam.

## 2013-08-21 NOTE — Addendum Note (Signed)
Addended by: Teddy Spike on: 08/21/2013 04:26 PM   Modules accepted: Orders

## 2013-08-22 LAB — PSA: PSA: 3.04 ng/mL (ref <=4.00)

## 2013-10-23 ENCOUNTER — Telehealth: Payer: Self-pay | Admitting: *Deleted

## 2013-10-23 MED ORDER — COLCHICINE 0.6 MG PO TABS: 0.6000 mg | ORAL_TABLET | Freq: Two times a day (BID) | ORAL | Status: DC | PRN

## 2013-10-23 NOTE — Telephone Encounter (Signed)
Called and informed pt that rx has been to pharm.Elou[facility]roe'

## 2013-10-23 NOTE — Telephone Encounter (Signed)
Pt called and stated that he is having a gout flare and would like for something to be called into his pharmacy. I did not see where this was prescribed to him previously he said that Dr. Valetta Close had given him this before he uses [facility] [place]

## 2013-10-23 NOTE — Telephone Encounter (Signed)
New rx sent

## 2014-02-03 ENCOUNTER — Other Ambulatory Visit: Payer: Self-pay | Admitting: Family Medicine

## 2014-03-03 ENCOUNTER — Ambulatory Visit (INDEPENDENT_AMBULATORY_CARE_PROVIDER_SITE_OTHER): Payer: Medicare Other | Admitting: Family Medicine

## 2014-03-03 ENCOUNTER — Encounter: Payer: Self-pay | Admitting: Family Medicine

## 2014-03-03 VITALS — BP 131/77 | HR 86 | Temp 97.7°F | Wt 251.0 lb

## 2014-03-03 DIAGNOSIS — Z23 Encounter for immunization: Secondary | ICD-10-CM

## 2014-03-03 DIAGNOSIS — M109 Gout, unspecified: Secondary | ICD-10-CM

## 2014-03-03 DIAGNOSIS — E785 Hyperlipidemia, unspecified: Secondary | ICD-10-CM

## 2014-03-03 DIAGNOSIS — R7301 Impaired fasting glucose: Secondary | ICD-10-CM

## 2014-03-03 DIAGNOSIS — E1169 Type 2 diabetes mellitus with other specified complication: Secondary | ICD-10-CM

## 2014-03-03 DIAGNOSIS — E669 Obesity, unspecified: Secondary | ICD-10-CM

## 2014-03-03 DIAGNOSIS — E119 Type 2 diabetes mellitus without complications: Secondary | ICD-10-CM

## 2014-03-03 DIAGNOSIS — I10 Essential (primary) hypertension: Secondary | ICD-10-CM

## 2014-03-03 LAB — POCT GLYCOSYLATED HEMOGLOBIN (HGB A1C): HEMOGLOBIN A1C: 6.5

## 2014-03-03 MED ORDER — METFORMIN HCL 500 MG PO TABS: 500.0000 mg | ORAL_TABLET | Freq: Two times a day (BID) | ORAL | Status: DC

## 2014-03-03 MED ORDER — LISINOPRIL 40 MG PO TABS: ORAL_TABLET | ORAL | Status: DC

## 2014-03-03 MED ORDER — HYDROCHLOROTHIAZIDE 25 MG PO TABS: ORAL_TABLET | ORAL | Status: DC

## 2014-03-03 MED ORDER — AMBULATORY NON FORMULARY MEDICATION
Status: DC
Start: 2014-03-03 — End: 2019-11-17

## 2014-03-03 MED ORDER — COLCHICINE 0.6 MG PO CAPS: ORAL_CAPSULE | ORAL | Status: DC

## 2014-03-03 MED ORDER — SIMVASTATIN 40 MG PO TABS: ORAL_TABLET | ORAL | Status: DC

## 2014-03-03 NOTE — Patient Instructions (Signed)
Please check your sugar twice a week on your new machine.

## 2014-03-03 NOTE — Assessment & Plan Note (Signed)
Will recheck uric acid level today. Consider putting him on Uloric, but with his Medicare it may be cost for him with his. We'll also send her for colchicine tonsils. They may be better covered than the colchicine tabs.

## 2014-03-03 NOTE — Assessment & Plan Note (Signed)
Tolerating statin well. Due to repeat liver and lipid.

## 2014-03-03 NOTE — Assessment & Plan Note (Signed)
New diagnosis. Discussed the importance of diet exercise and weight loss. Additional handouts and information provided. We'll schedule him with a nutritionist for further education. Given a prescription for a glucometer, lancets and strips today. We will start him on metformin. Discussed the risks and benefits of the medication. Followup in 3 months.

## 2014-03-03 NOTE — Assessment & Plan Note (Signed)
Well-controlled on current regimen. Due for CMP and fasting lipid panel.

## 2014-03-03 NOTE — Progress Notes (Signed)
Subjective:    Patient ID: Aaron Horn, male    DOB: 1945-07-13, 68 y.o.   MRN: [id number]  Hypertension   Hypertension- Pt denies chest pain, SOB, dizziness, or heart palpitations.  Taking meds as directed w/o problems.  Denies medication side effects.    Hyperlipidemia - doing well on statin. No mayalgias or S.E.   DM- New Dx as of April.  No increased thirst or urination. His weight is up 3 pounds.  Gout - he is allergic to allopurinol. Causes a rash. He still gets about 2 gout flares per year. He is in a prescription for colchicine to call unfortunately was going to cost $200.   Review of Systems     BP 131/77  Pulse 86  Temp(Src) 97.7 F (36.5 C)  Wt 251 lb (113.853 kg)    Allergies  Allergen Reactions  . Allopurinol     REACTION: rash    Past Medical History  Diagnosis Date  . AV block, 1st degree   . HTN (hypertension)     benign essential  . Impaired fasting glucose   . FH: colonic polyps   . Herpes zoster   . Dyslipidemia   . Gout     Past Surgical History  Procedure Laterality Date  . Tohphi removal foot  2000  . Colonoscopy  2002    polyps     History   Social History  . Marital Status: Married    Spouse Name: N/A    Number of Children: N/A  . Years of Education: N/A   Occupational History  . Not on file.   Social History Main Topics  . Smoking status: Former Research scientist (life sciences)  . Smokeless tobacco: Not on file     Comment: quit smoking in 1998 after 20 pack yr hx  . Alcohol Use: Yes     Comment: 2 etoh a night   . Drug Use:   . Sexual Activity:    Other Topics Concern  . Not on file   Social History Narrative   No regular exercise.     Family History  Problem Relation Age of Onset  . Hypertension Mother   . Esophageal cancer Father     Outpatient Encounter Prescriptions as of 03/03/2014  Medication Sig  . AMBULATORY NON FORMULARY MEDICATION Medication Name: Zostavax IM x 1  . aspirin (ASPIR-LOW) 81 MG EC tablet Take 81 mg  by mouth daily.    . Cholecalciferol (VITAMIN D3 PO) Take 1 capsule by mouth daily.  . fish oil-omega-3 fatty acids 1000 MG capsule Take 4 mg by mouth daily.   . hydrochlorothiazide (HYDRODIURIL) 25 MG tablet TAKE 1 TABLET EVERY DAY  . lisinopril (PRINIVIL,ZESTRIL) 40 MG tablet TAKE 1 TABLET EVERY DAY  . simvastatin (ZOCOR) 40 MG tablet TAKE 1 TABLET BY MOUTH EVERY DAY AT BEDTIME  . [DISCONTINUED] hydrochlorothiazide (HYDRODIURIL) 25 MG tablet TAKE 1 TABLET EVERY DAY  . [DISCONTINUED] lisinopril (PRINIVIL,ZESTRIL) 40 MG tablet TAKE 1 TABLET EVERY DAY  . [DISCONTINUED] simvastatin (ZOCOR) 40 MG tablet TAKE 1 TABLET BY MOUTH EVERY DAY AT BEDTIME  . AMBULATORY NON FORMULARY MEDICATION Medication Name: Glucometer, testing strips, and lancets.  Test once one day.  . Colchicine 0.6 MG CAPS 2 on Day 1, then one a day PRN gout flare.  Ok to sub generic or tabs if cheaper.  . metFORMIN (GLUCOPHAGE) 500 MG tablet Take 1 tablet (500 mg total) by mouth 2 (two) times daily with a meal.  . [DISCONTINUED] colchicine  0.6 MG tablet Take 1 tablet (0.6 mg total) by mouth 2 (two) times daily as needed.       Objective:   Physical Exam  Constitutional: He is oriented to person, place, and time. He appears well-developed and well-nourished.  HENT:  Head: Normocephalic and atraumatic.  Cardiovascular: Normal rate, regular rhythm and normal heart sounds.   Pulmonary/Chest: Effort normal and breath sounds normal.  Neurological: He is alert and oriented to person, place, and time.  Skin: Skin is warm and dry.  Psychiatric: He has a normal mood and affect. His behavior is normal.          Assessment & Plan:

## 2014-03-04 LAB — COMPLETE METABOLIC PANEL WITH GFR
ALK PHOS: 49 U/L (ref 39–117)
ALT: 35 U/L (ref 0–53)
AST: 23 U/L (ref 0–37)
Albumin: 4.6 g/dL (ref 3.5–5.2)
BILIRUBIN TOTAL: 0.6 mg/dL (ref 0.2–1.2)
BUN: 15 mg/dL (ref 6–23)
CO2: 27 mEq/L (ref 19–32)
Calcium: 9.4 mg/dL (ref 8.4–10.5)
Chloride: 100 mEq/L (ref 96–112)
Creat: 0.83 mg/dL (ref 0.50–1.35)
GFR, Est Non African American: >89 mL/min
Glucose, Bld: 147 mg/dL — ABNORMAL HIGH (ref 70–99)
Potassium: 4.5 mEq/L (ref 3.5–5.3)
Sodium: 137 mEq/L (ref 135–145)
Total Protein: 6.6 g/dL (ref 6.0–8.3)

## 2014-03-04 LAB — LIPID PANEL
CHOL/HDL RATIO: 3.7 ratio
Cholesterol: 176 mg/dL (ref 0–200)
HDL: 47 mg/dL (ref >39)
LDL CALC: 76 mg/dL (ref 0–99)
TRIGLYCERIDES: 266 mg/dL — AB (ref <150)
VLDL: 53 mg/dL — ABNORMAL HIGH (ref 0–40)

## 2014-03-04 LAB — URIC ACID: URIC ACID, SERUM: 9.4 mg/dL — AB (ref 4.0–7.8)

## 2014-03-26 ENCOUNTER — Telehealth: Payer: Self-pay | Admitting: *Deleted

## 2014-03-26 MED ORDER — FEBUXOSTAT 40 MG PO TABS: 40.0000 mg | ORAL_TABLET | Freq: Every day | ORAL | Status: DC

## 2014-03-26 NOTE — Telephone Encounter (Signed)
Pt called and stated that he would like a Rx for Uloric sent to his pharmacy. He thought that Dr. Madilyn Fireman was going to send this when he was seen at his last OV. [name], [name]

## 2014-03-26 NOTE — Telephone Encounter (Signed)
Please tell him that I am so sorry. Did forget to send it. I'm just present prescription now. He can check his pharmacy later today. If he has any problems getting it and let us know on Monday.

## 2014-05-24 ENCOUNTER — Other Ambulatory Visit: Payer: Self-pay | Admitting: Family Medicine

## 2014-05-27 ENCOUNTER — Other Ambulatory Visit: Payer: Self-pay | Admitting: Family Medicine

## 2014-05-27 MED ORDER — METFORMIN HCL 500 MG PO TABS: 500.0000 mg | ORAL_TABLET | Freq: Two times a day (BID) | ORAL | Status: DC

## 2014-06-03 ENCOUNTER — Encounter: Payer: Self-pay | Admitting: Family Medicine

## 2014-06-03 ENCOUNTER — Ambulatory Visit (INDEPENDENT_AMBULATORY_CARE_PROVIDER_SITE_OTHER): Payer: 59 | Admitting: Family Medicine

## 2014-06-03 VITALS — BP 128/70 | HR 68 | Ht 72.5 in | Wt 241.0 lb

## 2014-06-03 DIAGNOSIS — E1169 Type 2 diabetes mellitus with other specified complication: Secondary | ICD-10-CM

## 2014-06-03 DIAGNOSIS — I10 Essential (primary) hypertension: Secondary | ICD-10-CM

## 2014-06-03 DIAGNOSIS — E669 Obesity, unspecified: Secondary | ICD-10-CM

## 2014-06-03 DIAGNOSIS — M1A09X1 Idiopathic chronic gout, multiple sites, with tophus (tophi): Secondary | ICD-10-CM

## 2014-06-03 DIAGNOSIS — E119 Type 2 diabetes mellitus without complications: Secondary | ICD-10-CM

## 2014-06-03 LAB — POCT UA - MICROALBUMIN
Albumin/Creatinine Ratio, Urine, POC: <30
Creatinine, POC: 200 mg/dL
Microalbumin Ur, POC: 30 mg/L

## 2014-06-03 LAB — POCT GLYCOSYLATED HEMOGLOBIN (HGB A1C): Hemoglobin A1C: 6.2

## 2014-06-03 MED ORDER — SIMVASTATIN 40 MG PO TABS: 40.0000 mg | ORAL_TABLET | Freq: Every day | ORAL | Status: DC

## 2014-06-03 MED ORDER — HYDROCHLOROTHIAZIDE 25 MG PO TABS
ORAL_TABLET | ORAL | Status: DC
Start: 2014-06-03 — End: 2014-12-03

## 2014-06-03 MED ORDER — ALLOPURINOL 100 MG PO TABS: 100.0000 mg | ORAL_TABLET | Freq: Every day | ORAL | Status: DC

## 2014-06-03 MED ORDER — LISINOPRIL 40 MG PO TABS: ORAL_TABLET | ORAL | Status: DC

## 2014-06-03 NOTE — Progress Notes (Signed)
   Subjective:    Patient ID: Aaron Horn, male    DOB: May 02, 1946, 69 y.o.   MRN: [id number]  HPI Diabetes - no hypoglycemic events. No wounds or sores that are not healing well. No increased thirst or urination. Checking glucose at home. Taking medications as prescribed without any side effects. Eye exam is not up to date. Sugars running around 108.  Has been more exercising.  Lab Results  Component Value Date   HGBA1C 6.5 03/03/2014   Hypertension- Pt denies chest pain, SOB, dizziness, or heart palpitations.  Taking meds as directed w/o problems.  Denies medication side effects.    Home BPs running in the 120s.  He says he usually checks his blood pressure at the gym and it does well.  Gout -He thinks the sllopurinol may not have been the cause of his rash. Would like to retry it. No recent flares.   Review of Systems     Objective:   Physical Exam  Constitutional: He is oriented to person, place, and time. He appears well-developed and well-nourished.  HENT:  Head: Normocephalic and atraumatic.  Neck: Neck supple. No thyromegaly present.  Cardiovascular: Normal rate, regular rhythm and normal heart sounds.   Pulmonary/Chest: Effort normal and breath sounds normal.  Lymphadenopathy:    He has no cervical adenopathy.  Neurological: He is alert and oriented to person, place, and time.  Skin: Skin is warm and dry.  Psychiatric: He has a normal mood and affect. His behavior is normal.          Assessment & Plan:  Diabetes-improved. A1c is 6.2 today. He's really done a great job with his dietary changes. He felt working with a nutritionist was actually very helpful. He has had a cold and has not been in the gym the last couple weeks but says he plans on going back. Encouraged him to get back on track with diet and exercise.  Gout - his uric as levels back up to 9. It was pretty sleepy down to 6 with allopurinol. He had initially stopped it because he thought it caused a  rash but he says now he is not convinced it was the medication. He is willing to retry try it, as the Uloric was extremely expensive. With his history of tophi he really needs to be on a uric acid lowering medication.  HTN - Elevated BP -recheck was much improved. Continue current regimen and follow carefully.

## 2014-07-19 ENCOUNTER — Telehealth: Payer: Self-pay | Admitting: *Deleted

## 2014-07-19 DIAGNOSIS — E1169 Type 2 diabetes mellitus with other specified complication: Secondary | ICD-10-CM

## 2014-07-19 DIAGNOSIS — E669 Obesity, unspecified: Principal | ICD-10-CM

## 2014-07-19 MED ORDER — AMBULATORY NON FORMULARY MEDICATION: Status: DC

## 2014-07-20 NOTE — Telephone Encounter (Signed)
rx sent for pt.Aaron Horn

## 2014-07-29 ENCOUNTER — Encounter: Payer: Self-pay | Admitting: Family Medicine

## 2014-09-02 ENCOUNTER — Ambulatory Visit (INDEPENDENT_AMBULATORY_CARE_PROVIDER_SITE_OTHER): Payer: Medicare Other | Admitting: Family Medicine

## 2014-09-02 ENCOUNTER — Encounter: Payer: Self-pay | Admitting: Family Medicine

## 2014-09-02 VITALS — BP 123/71 | HR 61 | Ht 73.0 in | Wt 242.0 lb

## 2014-09-02 DIAGNOSIS — Z1159 Encounter for screening for other viral diseases: Secondary | ICD-10-CM

## 2014-09-02 DIAGNOSIS — E669 Obesity, unspecified: Secondary | ICD-10-CM

## 2014-09-02 DIAGNOSIS — L821 Other seborrheic keratosis: Secondary | ICD-10-CM

## 2014-09-02 DIAGNOSIS — Z125 Encounter for screening for malignant neoplasm of prostate: Secondary | ICD-10-CM

## 2014-09-02 DIAGNOSIS — I10 Essential (primary) hypertension: Secondary | ICD-10-CM | POA: Diagnosis not present

## 2014-09-02 DIAGNOSIS — E119 Type 2 diabetes mellitus without complications: Secondary | ICD-10-CM

## 2014-09-02 DIAGNOSIS — E1169 Type 2 diabetes mellitus with other specified complication: Secondary | ICD-10-CM

## 2014-09-02 DIAGNOSIS — Z23 Encounter for immunization: Secondary | ICD-10-CM

## 2014-09-02 LAB — BASIC METABOLIC PANEL WITH GFR
BUN: 15 mg/dL (ref 6–23)
CO2: 27 meq/L (ref 19–32)
Calcium: 9.4 mg/dL (ref 8.4–10.5)
Chloride: 101 mEq/L (ref 96–112)
Creat: 0.88 mg/dL (ref 0.50–1.35)
GFR, Est African American: >89 mL/min
GFR, Est Non African American: 87 mL/min
GLUCOSE: 125 mg/dL — AB (ref 70–99)
POTASSIUM: 4.6 meq/L (ref 3.5–5.3)
SODIUM: 138 meq/L (ref 135–145)

## 2014-09-02 LAB — POCT GLYCOSYLATED HEMOGLOBIN (HGB A1C): Hemoglobin A1C: 6

## 2014-09-02 NOTE — Addendum Note (Signed)
Addended by: Beatrice Lecher D on: 09/02/2014 05:25 PM   Modules accepted: Level of Service

## 2014-09-02 NOTE — Progress Notes (Addendum)
   Subjective:    Patient ID: Aaron Horn, male    DOB: 1946/02/04, 69 y.o.   MRN: [id number]  HPI Diabetes - no hypoglycemic events. No wounds or sores that are not healing well. No increased thirst or urination. Checking glucose at home. Taking medications as prescribed without any side effects.  Hypertension- Pt denies chest pain, SOB, dizziness, or heart palpitations.  Taking meds as directed w/o problems.  Denies medication side effects.    He has a lesion near his left ear in the scalp line and at the base of his neck anteriorly that he would like frozen today.  Review of Systems     Objective:   Physical Exam  Constitutional: He is oriented to person, place, and time. He appears well-developed and well-nourished.  HENT:  Head: Normocephalic and atraumatic.  Cardiovascular: Normal rate, regular rhythm and normal heart sounds.   Pulmonary/Chest: Effort normal and breath sounds normal.  Neurological: He is alert and oriented to person, place, and time.  Skin: Skin is warm and dry.  Psychiatric: He has a normal mood and affect. His behavior is normal.          Assessment & Plan:  DM- Well controlled.  REminded due for eye exam.  Follow-up in 3 months. He's done a fantastic job with diet and exercise and taking his medications regularly. He is on a statin and ACE inhibitor. He's also taking a low-dose aspirin daily.   HTN - well controlled. Continue current regimen.  Cryotherapy Procedure Note  Pre-operative Diagnosis: Seborrheic keratosis  Post-operative Diagnosis: same  Locations: Left temple below the hair, and base of the neck near the sternoclavicular joint.   Indications:   Anesthesia: not required    Procedure Details  Patient informed of risks (permanent scarring, infection, light or dark discoloration, bleeding, infection, weakness, numbness and recurrence of the lesion) and benefits of the procedure and verbal informed consent obtained.  The areas  are treated with liquid nitrogen therapy, frozen until ice ball extended 2 mm beyond lesion, allowed to thaw, and treated again. The patient tolerated procedure well.  The patient was instructed on post-op care, warned that there may be blister formation, redness and pain. Recommend OTC analgesia as needed for pain.  Condition: Stable  Complications: none.  Plan: 1. Instructed to keep the area dry and covered for 24-48h and clean thereafter. 2. Warning signs of infection were reviewed.   3. Recommended that the patient use OTC acetaminophen as needed for pain.  4. Return as needed.

## 2014-09-03 LAB — PSA: PSA: 3.68 ng/mL (ref <=4.00)

## 2014-09-03 LAB — HEPATITIS C ANTIBODY: HCV Ab: NEGATIVE

## 2014-10-01 ENCOUNTER — Encounter: Payer: Self-pay | Admitting: Family Medicine

## 2014-10-01 ENCOUNTER — Ambulatory Visit (INDEPENDENT_AMBULATORY_CARE_PROVIDER_SITE_OTHER): Payer: Medicare Other | Admitting: Family Medicine

## 2014-10-01 VITALS — BP 121/75 | HR 64 | Wt 233.0 lb

## 2014-10-01 DIAGNOSIS — Z0000 Encounter for general adult medical examination without abnormal findings: Secondary | ICD-10-CM

## 2014-10-01 NOTE — Progress Notes (Signed)
Subjective:    Aaron Horn is a 69 y.o. male who presents for Medicare Annual/Subsequent preventive examination.   Preventive Screening-Counseling & Management  Tobacco History  Smoking status  . Former Smoker  Smokeless tobacco  . Not on file    Comment: quit smoking in 1998 after 20 pack yr hx    Problems Prior to Visit 1.  Irregular exercise.    Current Problems (verified) Patient Active Problem List   Diagnosis Date Noted  . Obesity (BMI 30-39.9) 08/21/2013  . Ear ringing 08/21/2013  . Diabetes mellitus type 2 in obese 08/10/2009  . COLONIC POLYPS 07/21/2009  . AV BLOCK, 1ST DEGREE 07/21/2009  . HERPES ZOSTER 02/13/2009  . Dyslipidemia 03/02/2008  . Gout 03/02/2008  . ESSENTIAL HYPERTENSION, BENIGN 03/02/2008    Medications Prior to Visit Current Outpatient Prescriptions on File Prior to Visit  Medication Sig Dispense Refill  . allopurinol (ZYLOPRIM) 100 MG tablet Take 1 tablet (100 mg total) by mouth daily. 90 tablet 3  . AMBULATORY NON FORMULARY MEDICATION Medication Name: Glucometer, testing strips, and lancets.  Test once one day. 100 Units PRN  . AMBULATORY NON FORMULARY MEDICATION Medication Name: lancets  DX code: E11.9 type 2 DM 100 each prn  . aspirin (ASPIR-LOW) 81 MG EC tablet Take 81 mg by mouth daily.      . Cholecalciferol (VITAMIN D3 PO) Take 1 capsule by mouth. Taking once a week    . Colchicine 0.6 MG CAPS 2 on Day 1, then one a day PRN gout flare.  Ok to sub generic or tabs if cheaper. 30 capsule 11  . fish oil-omega-3 fatty acids 1000 MG capsule Take 4 mg by mouth daily.     . hydrochlorothiazide (HYDRODIURIL) 25 MG tablet TAKE 1 TABLET EVERY DAY 90 tablet 1  . lisinopril (PRINIVIL,ZESTRIL) 40 MG tablet TAKE 1 TABLET EVERY DAY 90 tablet 1  . metFORMIN (GLUCOPHAGE) 500 MG tablet Take 1 tablet (500 mg total) by mouth 2 (two) times daily with a meal. 180 tablet 1  . simvastatin (ZOCOR) 40 MG tablet Take 1 tablet (40 mg total) by mouth at  bedtime. 90 tablet 3   No current facility-administered medications on file prior to visit.    Current Medications (verified) Current Outpatient Prescriptions  Medication Sig Dispense Refill  . allopurinol (ZYLOPRIM) 100 MG tablet Take 1 tablet (100 mg total) by mouth daily. 90 tablet 3  . AMBULATORY NON FORMULARY MEDICATION Medication Name: Glucometer, testing strips, and lancets.  Test once one day. 100 Units PRN  . AMBULATORY NON FORMULARY MEDICATION Medication Name: lancets  DX code: E11.9 type 2 DM 100 each prn  . aspirin (ASPIR-LOW) 81 MG EC tablet Take 81 mg by mouth daily.      . Cholecalciferol (VITAMIN D3 PO) Take 1 capsule by mouth. Taking once a week    . Colchicine 0.6 MG CAPS 2 on Day 1, then one a day PRN gout flare.  Ok to sub generic or tabs if cheaper. 30 capsule 11  . fish oil-omega-3 fatty acids 1000 MG capsule Take 4 mg by mouth daily.     . hydrochlorothiazide (HYDRODIURIL) 25 MG tablet TAKE 1 TABLET EVERY DAY 90 tablet 1  . lisinopril (PRINIVIL,ZESTRIL) 40 MG tablet TAKE 1 TABLET EVERY DAY 90 tablet 1  . metFORMIN (GLUCOPHAGE) 500 MG tablet Take 1 tablet (500 mg total) by mouth 2 (two) times daily with a meal. 180 tablet 1  . simvastatin (ZOCOR) 40 MG tablet Take 1  tablet (40 mg total) by mouth at bedtime. 90 tablet 3   No current facility-administered medications for this visit.     Allergies (verified) Review of patient's allergies indicates no known allergies.   PAST HISTORY  Family History Family History  Problem Relation Age of Onset  . Hypertension Mother   . Esophageal cancer Father     Social History History  Substance Use Topics  . Smoking status: Former Research scientist (life sciences)  . Smokeless tobacco: Not on file     Comment: quit smoking in 1998 after 20 pack yr hx  . Alcohol Use: Yes     Comment: 2 etoh a night     Are there smokers in your home (other than you)?  No  Risk Factors Current exercise habits: The patient does not participate in regular  exercise at present.  Dietary issues discussed: None   Cardiac risk factors: advanced age (older than 22 for men, 19 for women), dyslipidemia, hypertension and male gender.  Depression Screen (Note: if answer to either of the following is "Yes", a more complete depression screening is indicated)   Q1: Over the past two weeks, have you felt down, depressed or hopeless? No  Q2: Over the past two weeks, have you felt little interest or pleasure in doing things? No  Have you lost interest or pleasure in daily life? No  Do you often feel hopeless? No  Do you cry easily over simple problems? No  Activities of Daily Living In your present state of health, do you have any difficulty performing the following activities?:  Driving? No Managing money?  No Feeding yourself? No Getting from bed to chair? No Climbing a flight of stairs? No Preparing food and eating?: No Bathing or showering? No Getting dressed: No Getting to the toilet? No Using the toilet:No Moving around from place to place: No In the past year have you fallen or had a near fall?:No   Hearing Difficulties: No Do you often ask people to speak up or repeat themselves? No Do you experience ringing or noises in your ears? No Do you have difficulty understanding soft or whispered voices? No   Do you feel that you have a problem with memory? No  Do you often misplace items? No  Do you feel safe at home?  Yes  Cognitive Testing  Alert? Yes  Normal Appearance?Yes  Oriented to person? Yes  Place? Yes   Time? Yes  Recall of three objects?  Yes  Can perform simple calculations? Yes  Displays appropriate judgment?Yes  Can read the correct time from a watch face?Yes   Advanced Directives have been discussed with the patient? No   List the Names of Other Physician/Practitioners you currently use: 1.    Indicate any recent Medical Services you may have received from other than [facility] providers in the past year (date may be  approximate).  Immunization History  Administered Date(s) Administered  . Influenza Split 02/06/2012  . Influenza Whole 03/02/2008, 02/02/2010  . Influenza,inj,Quad PF,36+ Mos 03/03/2014  . Pneumococcal Conjugate-13 09/02/2014  . Pneumococcal Polysaccharide-23 07/21/2009  . Td 05/14/2005    Screening Tests Health Maintenance  Topic Date Due  . OPHTHALMOLOGY EXAM  03/05/1954  . ZOSTAVAX  09/02/2015 (Originally 03/05/2004)  . INFLUENZA VACCINE  12/13/2014  . HEMOGLOBIN A1C  03/04/2015  . TETANUS/TDAP  05/15/2015  . FOOT EXAM  06/04/2015  . URINE MICROALBUMIN  06/04/2015  . COLONOSCOPY  05/15/2019  . Hepatitis C Screening  Completed  . PNA vac  Low Risk Adult  Completed    All answers were reviewed with the patient and necessary referrals were made:  METHENEY,CATHERINE, MD   10/01/2014   History reviewed: allergies, current medications, past family history, past medical history, past social history, past surgical history and problem list  Review of Systems A comprehensive review of systems was negative.    Objective:     Vision by Snellen chart: See chart Blood pressure 121/75, pulse 64, weight 233 lb (105.688 kg), SpO2 97 %. Body mass index is 30.75 kg/(m^2).  BP 121/75 mmHg  Pulse 64  Wt 233 lb (105.688 kg)  SpO2 97% General appearance: alert, cooperative and appears stated age Head: Normocephalic, without obvious abnormality, atraumatic Eyes: conj clear, EOMI, PEERLA Ears: normal TM's and external ear canals both ears Nose: Nares normal. Septum midline. Mucosa normal. No drainage or sinus tenderness. Throat: lips, mucosa, and tongue normal; teeth and gums normal Neck: no adenopathy, no carotid bruit, no JVD, supple, symmetrical, trachea midline and thyroid not enlarged, symmetric, no tenderness/mass/nodules Back: symmetric, no curvature. ROM normal. No CVA tenderness. Lungs: clear to auscultation bilaterally Chest wall: no tenderness Heart: regular rate and  rhythm, S1, S2 normal, no murmur, click, rub or gallop Abdomen: soft, non-tender; bowel sounds normal; no masses,  no organomegaly Extremities: extremities normal, atraumatic, no cyanosis or edema Pulses: 2+ and symmetric Skin: Skin color, texture, turgor normal. No rashes or lesions Lymph nodes: Cervical adenopathy: nl and Axillary adenopathy: nl Neurologic: Alert and oriented X 3, normal strength and tone. Normal symmetric reflexes. Normal coordination and gait     Assessment:     Medicare Wellness exam      Plan:     During the course of the visit the patient was educated and counseled about appropriate screening and preventive services including:    Glaucoma screening  Needs eye exam.  reminded to schedule.    Colonoscopy done last week at [place] review for nutrition referral? Yes ____  Not Indicated __X__   Patient Instructions (the written plan) was given to the patient.  Medicare Attestation I have personally reviewed: The patient's medical and social history Their use of alcohol, tobacco or illicit drugs Their current medications and supplements The patient's functional ability including ADLs,fall risks, home safety risks, cognitive, and hearing and visual impairment Diet and physical activities Evidence for depression or mood disorders  The patient's weight, height, BMI, and visual acuity have been recorded in the chart.  I have made referrals, counseling, and provided education to the patient based on review of the above and I have provided the patient with a written personalized care plan for preventive services.     METHENEY,CATHERINE, MD   10/01/2014

## 2014-11-29 ENCOUNTER — Other Ambulatory Visit: Payer: Self-pay | Admitting: Family Medicine

## 2014-12-01 ENCOUNTER — Ambulatory Visit: Payer: 59 | Admitting: Family Medicine

## 2014-12-03 ENCOUNTER — Encounter: Payer: Self-pay | Admitting: Family Medicine

## 2014-12-03 ENCOUNTER — Ambulatory Visit (INDEPENDENT_AMBULATORY_CARE_PROVIDER_SITE_OTHER): Payer: Medicare Other | Admitting: Family Medicine

## 2014-12-03 VITALS — BP 132/67 | HR 61 | Ht 73.0 in | Wt 240.0 lb

## 2014-12-03 DIAGNOSIS — E1169 Type 2 diabetes mellitus with other specified complication: Secondary | ICD-10-CM

## 2014-12-03 DIAGNOSIS — E669 Obesity, unspecified: Secondary | ICD-10-CM

## 2014-12-03 DIAGNOSIS — E119 Type 2 diabetes mellitus without complications: Secondary | ICD-10-CM | POA: Diagnosis not present

## 2014-12-03 DIAGNOSIS — M1A09X1 Idiopathic chronic gout, multiple sites, with tophus (tophi): Secondary | ICD-10-CM | POA: Diagnosis not present

## 2014-12-03 DIAGNOSIS — I10 Essential (primary) hypertension: Secondary | ICD-10-CM | POA: Diagnosis not present

## 2014-12-03 DIAGNOSIS — L219 Seborrheic dermatitis, unspecified: Secondary | ICD-10-CM

## 2014-12-03 LAB — POCT GLYCOSYLATED HEMOGLOBIN (HGB A1C): HEMOGLOBIN A1C: 6.2

## 2014-12-03 MED ORDER — LISINOPRIL 40 MG PO TABS: ORAL_TABLET | ORAL | Status: DC

## 2014-12-03 MED ORDER — SIMVASTATIN 40 MG PO TABS: 40.0000 mg | ORAL_TABLET | Freq: Every day | ORAL | Status: DC

## 2014-12-03 MED ORDER — HYDROCHLOROTHIAZIDE 25 MG PO TABS: ORAL_TABLET | ORAL | Status: DC

## 2014-12-03 MED ORDER — KETOCONAZOLE 2 % EX SHAM: 1.0000 "application " | MEDICATED_SHAMPOO | CUTANEOUS | Status: DC

## 2014-12-03 NOTE — Progress Notes (Signed)
   Subjective:    Patient ID: Aaron Horn, male    DOB: [birth year], 69 y.o.   MRN: [id number]  HPI Diabetes - no hypoglycemic events. No wounds or sores that are not healing well. No increased thirst or urination. Checking glucose at home. Taking medications as prescribed without any side effects.  Hypertension- Pt denies chest pain, SOB, dizziness, or heart palpitations.  Taking meds as directed w/o problems.  Denies medication side effects.    Gout-currently on allopurinol. He decided to retry the medication back in January. Rash has recurrent on chest. Thinks it is from the allopurinol.    Review of Systems     Objective:   Physical Exam  Constitutional: He is oriented to person, place, and time. He appears well-developed and well-nourished.  HENT:  Head: Normocephalic and atraumatic.  Cardiovascular: Normal rate, regular rhythm and normal heart sounds.   Pulmonary/Chest: Effort normal and breath sounds normal.  Neurological: He is alert and oriented to person, place, and time.  Skin: Skin is warm and dry.  Psychiatric: He has a normal mood and affect. His behavior is normal.          Assessment & Plan:  DM - reminded patient to get eye exam. Well controlled.  A1C of 6.2 today. Up a little from last time.  Work on diet and exercise.    Gout- well controlled on allopurinol. Uses colchicine prn.  Not sure the rash if really from his allopurinol.    HTN - well controlled. Continue current regimen.  Seborrheic dermatitis on chest.  - will treat with topical ketoconazolde.

## 2014-12-03 NOTE — Patient Instructions (Addendum)
Please try to get a full eye exam done before your next visit. [employer] is a Surveyor, quantity app to count calories.

## 2015-02-25 LAB — HM DIABETES EYE EXAM

## 2015-03-07 ENCOUNTER — Other Ambulatory Visit: Payer: Self-pay | Admitting: Family Medicine

## 2015-03-07 ENCOUNTER — Ambulatory Visit (INDEPENDENT_AMBULATORY_CARE_PROVIDER_SITE_OTHER): Payer: Medicare Other | Admitting: Family Medicine

## 2015-03-07 ENCOUNTER — Encounter: Payer: Self-pay | Admitting: Family Medicine

## 2015-03-07 VITALS — BP 157/86 | HR 63 | Temp 98.4°F | Resp 18 | Wt 243.5 lb

## 2015-03-07 DIAGNOSIS — E119 Type 2 diabetes mellitus without complications: Secondary | ICD-10-CM | POA: Diagnosis not present

## 2015-03-07 DIAGNOSIS — Z23 Encounter for immunization: Secondary | ICD-10-CM

## 2015-03-07 DIAGNOSIS — I10 Essential (primary) hypertension: Secondary | ICD-10-CM | POA: Diagnosis not present

## 2015-03-07 DIAGNOSIS — M1A09X1 Idiopathic chronic gout, multiple sites, with tophus (tophi): Secondary | ICD-10-CM | POA: Diagnosis not present

## 2015-03-07 DIAGNOSIS — E669 Obesity, unspecified: Secondary | ICD-10-CM

## 2015-03-07 DIAGNOSIS — E1169 Type 2 diabetes mellitus with other specified complication: Secondary | ICD-10-CM

## 2015-03-07 DIAGNOSIS — E785 Hyperlipidemia, unspecified: Secondary | ICD-10-CM | POA: Diagnosis not present

## 2015-03-07 LAB — COMPLETE METABOLIC PANEL WITH GFR
ALBUMIN: 4.3 g/dL (ref 3.6–5.1)
ALK PHOS: 46 U/L (ref 40–115)
ALT: 31 U/L (ref 9–46)
AST: 24 U/L (ref 10–35)
BUN: 15 mg/dL (ref 7–25)
CO2: 29 mmol/L (ref 20–31)
Calcium: 9.5 mg/dL (ref 8.6–10.3)
Chloride: 101 mmol/L (ref 98–110)
Creat: 0.86 mg/dL (ref 0.70–1.18)
GFR, EST NON AFRICAN AMERICAN: 87 mL/min (ref >=60)
Glucose, Bld: 122 mg/dL — ABNORMAL HIGH (ref 65–99)
Potassium: 4.7 mmol/L (ref 3.5–5.3)
Sodium: 140 mmol/L (ref 135–146)
Total Bilirubin: 0.5 mg/dL (ref 0.2–1.2)
Total Protein: 6.7 g/dL (ref 6.1–8.1)

## 2015-03-07 LAB — LIPID PANEL
Cholesterol: 150 mg/dL (ref 125–200)
HDL: 53 mg/dL (ref >=40)
LDL Cholesterol: 75 mg/dL (ref <130)
Total CHOL/HDL Ratio: 2.8 Ratio (ref <=5.0)
Triglycerides: 111 mg/dL (ref <150)
VLDL: 22 mg/dL (ref <30)

## 2015-03-07 LAB — POCT GLYCOSYLATED HEMOGLOBIN (HGB A1C): Hemoglobin A1C: 6.3

## 2015-03-07 LAB — URIC ACID: Uric Acid, Serum: 6.7 mg/dL (ref 4.0–7.8)

## 2015-03-07 NOTE — Progress Notes (Signed)
   Subjective:    Patient ID: Aaron Horn, male    DOB: Jun 14, 1945, 69 y.o.   MRN: [id number]  HPI Diabetes - no hypoglycemic events. No wounds or sores that are not healing well. No increased thirst or urination. Checking glucose at home. Taking medications as prescribed without any side effects.  Hypertension- Pt denies chest pain, SOB, dizziness, or heart palpitations.  Taking meds as directed w/o problems.  Denies medication side effects.    Hyperlpidemoa = dong well on statin. NO S.E.   Review of Systems     Objective:   Physical Exam  Constitutional: He is oriented to person, place, and time. He appears well-developed and well-nourished.  HENT:  Head: Normocephalic and atraumatic.  Cardiovascular: Normal rate, regular rhythm and normal heart sounds.   Pulmonary/Chest: Effort normal and breath sounds normal.  Neurological: He is alert and oriented to person, place, and time.  Skin: Skin is warm and dry.  Psychiatric: He has a normal mood and affect. His behavior is normal.          Assessment & Plan:  DM- well controlled. Had eye exam Valda Lamb in [facility] for her eye exam. Due for CMp and lipids.  A1C is 6.3.  On statin, ASA. F/U in 4 mo.   HTN - well controlled. continue current medication   Hyperlipidemia - continue current regimen. Due to recheck lipids.

## 2015-03-17 ENCOUNTER — Other Ambulatory Visit: Payer: Self-pay | Admitting: Family Medicine

## 2015-05-21 ENCOUNTER — Other Ambulatory Visit: Payer: Self-pay | Admitting: Family Medicine

## 2015-06-01 ENCOUNTER — Other Ambulatory Visit: Payer: Self-pay | Admitting: Family Medicine

## 2015-06-27 ENCOUNTER — Other Ambulatory Visit: Payer: Self-pay | Admitting: Family Medicine

## 2015-07-07 ENCOUNTER — Encounter: Payer: Self-pay | Admitting: Family Medicine

## 2015-07-07 ENCOUNTER — Ambulatory Visit (INDEPENDENT_AMBULATORY_CARE_PROVIDER_SITE_OTHER): Payer: Medicare Other | Admitting: Family Medicine

## 2015-07-07 VITALS — BP 123/66 | HR 66 | Ht 73.0 in | Wt 241.0 lb

## 2015-07-07 DIAGNOSIS — I10 Essential (primary) hypertension: Secondary | ICD-10-CM

## 2015-07-07 DIAGNOSIS — E119 Type 2 diabetes mellitus without complications: Secondary | ICD-10-CM

## 2015-07-07 DIAGNOSIS — E1169 Type 2 diabetes mellitus with other specified complication: Secondary | ICD-10-CM

## 2015-07-07 DIAGNOSIS — E669 Obesity, unspecified: Secondary | ICD-10-CM | POA: Diagnosis not present

## 2015-07-07 LAB — POCT GLYCOSYLATED HEMOGLOBIN (HGB A1C): Hemoglobin A1C: 6.3

## 2015-07-07 MED ORDER — METFORMIN HCL 1000 MG PO TABS: ORAL_TABLET | ORAL | Status: DC

## 2015-07-07 MED ORDER — AMBULATORY NON FORMULARY MEDICATION: Status: DC

## 2015-07-07 NOTE — Progress Notes (Signed)
   Subjective:    Patient ID: Aaron Horn, male    DOB: 1946/05/08, 70 y.o.   MRN: [id number]  HPI Hypertension- Pt denies chest pain, SOB, dizziness, or heart palpitations.  Taking meds as directed w/o problems.  Denies medication side effects.    Diabetes - no hypoglycemic events. No wounds or sores that are not healing well. No increased thirst or urination. Checking glucose at home. Taking medications as prescribed without any side effects. Highest glucose 150s.     Review of Systems     Objective:   Physical Exam  Constitutional: He is oriented to person, place, and time. He appears well-developed and well-nourished.  HENT:  Head: Normocephalic and atraumatic.  Cardiovascular: Normal rate, regular rhythm and normal heart sounds.   Pulmonary/Chest: Effort normal and breath sounds normal.  Neurological: He is alert and oriented to person, place, and time.  Skin: Skin is warm and dry.  Psychiatric: He has a normal mood and affect. His behavior is normal.       Assessment & Plan:  HTN - well controlled. Continue current regimen.    DM- well controlled. On ACE. Increase metfomrin to 1000 since home sugars have crept up. F/U in 3 months.

## 2015-07-08 ENCOUNTER — Ambulatory Visit: Payer: Medicare Other | Admitting: Family Medicine

## 2015-10-01 ENCOUNTER — Other Ambulatory Visit: Payer: Self-pay | Admitting: Family Medicine

## 2015-10-05 ENCOUNTER — Encounter: Payer: Self-pay | Admitting: Family Medicine

## 2015-10-05 ENCOUNTER — Ambulatory Visit (INDEPENDENT_AMBULATORY_CARE_PROVIDER_SITE_OTHER): Payer: Medicare Other | Admitting: Family Medicine

## 2015-10-05 VITALS — BP 133/70 | HR 70 | Wt 237.0 lb

## 2015-10-05 DIAGNOSIS — Z125 Encounter for screening for malignant neoplasm of prostate: Secondary | ICD-10-CM

## 2015-10-05 DIAGNOSIS — E119 Type 2 diabetes mellitus without complications: Secondary | ICD-10-CM

## 2015-10-05 DIAGNOSIS — E669 Obesity, unspecified: Secondary | ICD-10-CM | POA: Diagnosis not present

## 2015-10-05 DIAGNOSIS — I10 Essential (primary) hypertension: Secondary | ICD-10-CM | POA: Diagnosis not present

## 2015-10-05 DIAGNOSIS — E1169 Type 2 diabetes mellitus with other specified complication: Secondary | ICD-10-CM

## 2015-10-05 LAB — BASIC METABOLIC PANEL
BUN: 13 mg/dL (ref 7–25)
CALCIUM: 9.3 mg/dL (ref 8.6–10.3)
CO2: 23 mmol/L (ref 20–31)
CREATININE: 0.72 mg/dL (ref 0.70–1.18)
Chloride: 102 mmol/L (ref 98–110)
GLUCOSE: 107 mg/dL — AB (ref 65–99)
Potassium: 4.5 mmol/L (ref 3.5–5.3)
SODIUM: 138 mmol/L (ref 135–146)

## 2015-10-05 LAB — POCT GLYCOSYLATED HEMOGLOBIN (HGB A1C): Hemoglobin A1C: 5.9

## 2015-10-05 NOTE — Progress Notes (Signed)
Subjective:    CC: DM, HTN  HPI:  Diabetes - no hypoglycemic events. No wounds or sores that are not healing well. No increased thirst or urination. Checking glucose at home. Taking medications as prescribed without any side effects.  Hypertension- Pt denies chest pain, SOB, dizziness, or heart palpitations.  Taking meds as directed w/o problems.  Denies medication side effects.     Past medical history, Surgical history, Family history not pertinant except as noted below, Social history, Allergies, and medications have been entered into the medical record, reviewed, and corrections made.   Review of Systems: No fevers, chills, night sweats, weight loss, chest pain, or shortness of breath.   Objective:    General: Well Developed, well nourished, and in no acute distress.  Neuro: Alert and oriented x3, extra-ocular muscles intact, sensation grossly intact.  HEENT: Normocephalic, atraumatic  Skin: Warm and dry, no rashes. Cardiac: Regular rate and rhythm, no murmurs rubs or gallops, no lower extremity edema.  Respiratory: Clear to auscultation bilaterally. Not using accessory muscles, speaking in full sentences.   Impression and Recommendations:   DM- Well controlled. Hemoglobin A1c of 5.9 today which is down from previous of 6.3. Great job. Keep the good work up. Due for BMP. Next  Hypertension-well controlled. Continue current regimen.

## 2015-10-06 LAB — PSA: PSA: 6.65 ng/mL — ABNORMAL HIGH (ref <=4.00)

## 2015-10-26 ENCOUNTER — Other Ambulatory Visit: Payer: Self-pay | Admitting: Family Medicine

## 2015-11-10 ENCOUNTER — Telehealth: Payer: Self-pay

## 2015-11-10 DIAGNOSIS — R972 Elevated prostate specific antigen [PSA]: Secondary | ICD-10-CM

## 2015-11-10 NOTE — Telephone Encounter (Signed)
Called pt back and he wanted to know what he should do regarding his repeat lab for PSA. I told him that he can go down at his leisure to have this done. [name], [name]

## 2015-11-10 NOTE — Telephone Encounter (Signed)
Celso called and wanted to speak with [name]. I told him I could send her a message. He hung up.

## 2015-11-17 LAB — PSA: PSA: 3.45 ng/mL (ref <=4.00)

## 2015-11-30 ENCOUNTER — Other Ambulatory Visit: Payer: Self-pay | Admitting: Family Medicine

## 2015-12-02 ENCOUNTER — Other Ambulatory Visit: Payer: Self-pay | Admitting: Family Medicine

## 2015-12-12 ENCOUNTER — Other Ambulatory Visit: Payer: Self-pay | Admitting: Family Medicine

## 2015-12-23 ENCOUNTER — Other Ambulatory Visit: Payer: Self-pay | Admitting: Family Medicine

## 2015-12-26 ENCOUNTER — Other Ambulatory Visit: Payer: Self-pay | Admitting: Family Medicine

## 2016-02-06 ENCOUNTER — Encounter: Payer: Self-pay | Admitting: Family Medicine

## 2016-02-06 ENCOUNTER — Ambulatory Visit (INDEPENDENT_AMBULATORY_CARE_PROVIDER_SITE_OTHER): Payer: Medicare Other | Admitting: Family Medicine

## 2016-02-06 VITALS — BP 128/63 | HR 62 | Ht 73.0 in | Wt 244.0 lb

## 2016-02-06 DIAGNOSIS — M1A09X1 Idiopathic chronic gout, multiple sites, with tophus (tophi): Secondary | ICD-10-CM

## 2016-02-06 DIAGNOSIS — E119 Type 2 diabetes mellitus without complications: Secondary | ICD-10-CM

## 2016-02-06 DIAGNOSIS — Z23 Encounter for immunization: Secondary | ICD-10-CM | POA: Diagnosis not present

## 2016-02-06 DIAGNOSIS — E1169 Type 2 diabetes mellitus with other specified complication: Secondary | ICD-10-CM

## 2016-02-06 DIAGNOSIS — E669 Obesity, unspecified: Secondary | ICD-10-CM | POA: Diagnosis not present

## 2016-02-06 DIAGNOSIS — I10 Essential (primary) hypertension: Secondary | ICD-10-CM

## 2016-02-06 DIAGNOSIS — L820 Inflamed seborrheic keratosis: Secondary | ICD-10-CM | POA: Diagnosis not present

## 2016-02-06 LAB — POCT GLYCOSYLATED HEMOGLOBIN (HGB A1C): HEMOGLOBIN A1C: 6

## 2016-02-06 MED ORDER — KETOCONAZOLE 2 % EX SHAM: 1.0000 "application " | MEDICATED_SHAMPOO | CUTANEOUS | 0 refills | Status: DC

## 2016-02-06 NOTE — Progress Notes (Signed)
Subjective:    CC: DM  HPI: Diabetes - no hypoglycemic events. No wounds or sores that are not healing well. No increased thirst or urination. Checking glucose at home. Taking medications as prescribed without any side effects.  Hypertension- Pt denies chest pain, SOB, dizziness, or heart palpitations.  Taking meds as directed w/o problems.  Denies medication side effects.    He also has about 3 lesions on his scalp as well as one on his left anterior shin that he would like me to look at today. He would like to have them frozen off of possible.   Gout-overall he is doing really well. He says he takes his allopurinol daily and says he is rarely had to take the colchicine. He says in fact he doesn't think he is taken it since last time he was here. No recent flares.  Past medical history, Surgical history, Family history not pertinant except as noted below, Social history, Allergies, and medications have been entered into the medical record, reviewed, and corrections made.   Review of Systems: No fevers, chills, night sweats, weight loss, chest pain, or shortness of breath.   Objective:    General: Well Developed, well nourished, and in no acute distress.  Neuro: Alert and oriented x3, extra-ocular muscles intact, sensation grossly intact.  HEENT: Normocephalic, atraumatic  Skin: Warm and dry, no rashes. Cardiac: Regular rate and rhythm, no murmurs rubs or gallops, no lower extremity edema.  Respiratory: Clear to auscultation bilaterally. Not using accessory muscles, speaking in full sentences.   Impression and Recommendations:   DM- Well controlled. Continue current regimen. Follow up in  3-4 months.   Lab Results  Component Value Date   HGBA1C 6.0 02/06/2016     HTN - Well controlled. Continue current regimen. Follow up in  4 mo.  Gout - well controlled. Due to recheck in October.   Lab Results  Component Value Date   LABURIC 6.7 03/07/2015     Seborrheic keratoses-he has  one on his left lower shin and one on his left scalp. Cryotherapy performed. Patient tolerated well.  Cryotherapy Procedure Note  Pre-operative Diagnosis: seborrheic keratoses  Post-operative Diagnosis: same  Locations: left anterior lower shin, and 2 lesion on the right scalp and 1 on the left scalp   Indications: irritation  Anesthesia: not required    Procedure Details  Patient informed of risks (permanent scarring, infection, light or dark discoloration, bleeding, infection, weakness, numbness and recurrence of the lesion) and benefits of the procedure and verbal informed consent obtained.  The areas are treated with liquid nitrogen therapy, frozen until ice ball extended 2 mm beyond lesion, allowed to thaw, and treated again. The patient tolerated procedure well.  The patient was instructed on post-op care, warned that there may be blister formation, redness and pain. Recommend OTC analgesia as needed for pain.  Condition: Stable  Complications: None  Plan: 1. Instructed to keep the area dry and covered for 24-48h and clean thereafter. 2. Warning signs of infection were reviewed.   3. Recommended that the patient use OTC acetaminophen as needed for pain.  4. Return PRN./

## 2016-03-02 LAB — HM DIABETES EYE EXAM

## 2016-03-12 ENCOUNTER — Encounter: Payer: Self-pay | Admitting: Family Medicine

## 2016-03-17 LAB — LIPID PANEL
CHOL/HDL RATIO: 4.3 ratio (ref <=5.0)
CHOLESTEROL: 177 mg/dL (ref 125–200)
HDL: 41 mg/dL (ref >=40)
LDL Cholesterol: 79 mg/dL (ref <130)
Triglycerides: 287 mg/dL — ABNORMAL HIGH (ref <150)
VLDL: 57 mg/dL — AB (ref <30)

## 2016-03-17 LAB — COMPLETE METABOLIC PANEL WITH GFR
ALT: 62 U/L — ABNORMAL HIGH (ref 9–46)
AST: 56 U/L — AB (ref 10–35)
Albumin: 4.5 g/dL (ref 3.6–5.1)
Alkaline Phosphatase: 45 U/L (ref 40–115)
BUN: 19 mg/dL (ref 7–25)
CHLORIDE: 99 mmol/L (ref 98–110)
CO2: 28 mmol/L (ref 20–31)
Calcium: 9.9 mg/dL (ref 8.6–10.3)
Creat: 0.84 mg/dL (ref 0.70–1.18)
GFR, EST NON AFRICAN AMERICAN: 87 mL/min (ref >=60)
GFR, Est African American: >89 mL/min (ref >=60)
Glucose, Bld: 100 mg/dL — ABNORMAL HIGH (ref 65–99)
POTASSIUM: 4.4 mmol/L (ref 3.5–5.3)
SODIUM: 136 mmol/L (ref 135–146)
Total Bilirubin: 0.4 mg/dL (ref 0.2–1.2)
Total Protein: 6.9 g/dL (ref 6.1–8.1)

## 2016-05-22 ENCOUNTER — Other Ambulatory Visit: Payer: Self-pay | Admitting: Family Medicine

## 2016-06-04 ENCOUNTER — Other Ambulatory Visit: Payer: Self-pay | Admitting: *Deleted

## 2016-06-04 MED ORDER — METFORMIN HCL 1000 MG PO TABS: ORAL_TABLET | ORAL | 3 refills | Status: DC

## 2016-06-07 ENCOUNTER — Ambulatory Visit (INDEPENDENT_AMBULATORY_CARE_PROVIDER_SITE_OTHER): Payer: Medicare Other | Admitting: Family Medicine

## 2016-06-07 VITALS — BP 139/72 | HR 59 | Ht 73.0 in | Wt 243.0 lb

## 2016-06-07 DIAGNOSIS — I10 Essential (primary) hypertension: Secondary | ICD-10-CM

## 2016-06-07 DIAGNOSIS — E1169 Type 2 diabetes mellitus with other specified complication: Secondary | ICD-10-CM

## 2016-06-07 DIAGNOSIS — H539 Unspecified visual disturbance: Secondary | ICD-10-CM

## 2016-06-07 DIAGNOSIS — R748 Abnormal levels of other serum enzymes: Secondary | ICD-10-CM | POA: Diagnosis not present

## 2016-06-07 DIAGNOSIS — E669 Obesity, unspecified: Secondary | ICD-10-CM

## 2016-06-07 LAB — POCT GLYCOSYLATED HEMOGLOBIN (HGB A1C): HEMOGLOBIN A1C: 6.2

## 2016-06-07 LAB — POCT UA - MICROALBUMIN
Creatinine, POC: 50 mg/dL
MICROALBUMIN (UR) POC: 10 mg/L

## 2016-06-07 NOTE — Progress Notes (Signed)
Subjective:    CC: DM, HTN  HPI: Diabetes - no hypoglycemic events. No wounds or sores that are not healing well. No increased thirst or urination. Checking glucose at home. Taking medications as prescribed without any side effects.  Hypertension- Pt denies chest pain, SOB, dizziness, or heart palpitations.  Taking meds as directed w/o problems.  Denies medication side effects.    He did want to mention one other thing that occurred this morning. He says a lot of nights he doesn't sleep well he typically has chronic insomnia but when he got up this morning and the lights were off he noticed that when he looked around and saw some of the smaller lites that showed that things are plugged in in a room that it was dimming and brightening in a pattern that was consistent with his pulse. He never had that happen before. And he was otherwise feeling well.  Also on recent lab work he had elevated liver enzymes. He said he has tried to cut back on Tylenol products he is due to have that rechecked today.  Past medical history, Surgical history, Family history not pertinant except as noted below, Social history, Allergies, and medications have been entered into the medical record, reviewed, and corrections made.   Review of Systems: No fevers, chills, night sweats, weight loss, chest pain, or shortness of breath.   Objective:    General: Well Developed, well nourished, and in no acute distress.  Neuro: Alert and oriented x3, extra-ocular muscles intact, sensation grossly intact.  HEENT: Normocephalic, atraumatic  Skin: Warm and dry, no rashes. Cardiac: Regular rate and rhythm, no murmurs rubs or gallops, no lower extremity edema.  Respiratory: Clear to auscultation bilaterally. Not using accessory muscles, speaking in full sentences.   Impression and Recommendations:    DM - Controlled. Hemoglobin A1c up a little bit at 6.2. Otherwise doing fantastic. Encouraged him to get back on track with his  exercise as that has fallen off more recently. Lab work is up-to-date. On ACE.   HTN -  borderline. . Continue current regimen. Will monitor. Follow up in  6 months.   Vision change-his eye exam is up-to-date. Fact he just went in October. If symptoms recur that encouraged him to go back to his eye doctor.  Elevated liver enzymes-check liver enzymes today

## 2016-06-22 LAB — HEPATIC FUNCTION PANEL
ALBUMIN: 4.6 g/dL (ref 3.6–5.1)
ALK PHOS: 50 U/L (ref 40–115)
ALT: 43 U/L (ref 9–46)
AST: 39 U/L — ABNORMAL HIGH (ref 10–35)
BILIRUBIN INDIRECT: 0.3 mg/dL (ref 0.2–1.2)
BILIRUBIN TOTAL: 0.4 mg/dL (ref 0.2–1.2)
Bilirubin, Direct: 0.1 mg/dL (ref <=0.2)
Total Protein: 6.7 g/dL (ref 6.1–8.1)

## 2016-06-25 ENCOUNTER — Other Ambulatory Visit: Payer: Self-pay | Admitting: Family Medicine

## 2016-06-25 NOTE — Addendum Note (Signed)
Addended by: Teddy Spike on: 06/25/2016 12:21 PM   Modules accepted: Orders

## 2016-08-09 ENCOUNTER — Other Ambulatory Visit: Payer: Self-pay | Admitting: *Deleted

## 2016-08-09 MED ORDER — GLUCOSE BLOOD VI STRP: ORAL_STRIP | 12 refills | Status: DC

## 2016-08-28 ENCOUNTER — Telehealth: Payer: Self-pay | Admitting: Family Medicine

## 2016-08-28 ENCOUNTER — Other Ambulatory Visit: Payer: Self-pay | Admitting: *Deleted

## 2016-08-28 MED ORDER — LISINOPRIL 40 MG PO TABS: 40.0000 mg | ORAL_TABLET | Freq: Every day | ORAL | 1 refills | Status: DC

## 2016-08-28 NOTE — Telephone Encounter (Signed)
Patient left message for [place] to call him. I called him and he stated he was calling to find out his rx refills had not been filled. I confirmed correct pharmacy and advised him the request had just been submitted this morning to [facility] in [facility].

## 2016-09-06 ENCOUNTER — Ambulatory Visit (INDEPENDENT_AMBULATORY_CARE_PROVIDER_SITE_OTHER): Payer: Medicare Other | Admitting: Family Medicine

## 2016-09-06 ENCOUNTER — Encounter: Payer: Self-pay | Admitting: Family Medicine

## 2016-09-06 VITALS — BP 132/74 | HR 63 | Ht 73.0 in | Wt 250.0 lb

## 2016-09-06 DIAGNOSIS — M1A09X1 Idiopathic chronic gout, multiple sites, with tophus (tophi): Secondary | ICD-10-CM | POA: Diagnosis not present

## 2016-09-06 DIAGNOSIS — E669 Obesity, unspecified: Secondary | ICD-10-CM | POA: Diagnosis not present

## 2016-09-06 DIAGNOSIS — E1169 Type 2 diabetes mellitus with other specified complication: Secondary | ICD-10-CM | POA: Diagnosis not present

## 2016-09-06 DIAGNOSIS — L821 Other seborrheic keratosis: Secondary | ICD-10-CM

## 2016-09-06 DIAGNOSIS — I10 Essential (primary) hypertension: Secondary | ICD-10-CM | POA: Diagnosis not present

## 2016-09-06 LAB — POCT GLYCOSYLATED HEMOGLOBIN (HGB A1C): HEMOGLOBIN A1C: 6.1

## 2016-09-06 NOTE — Progress Notes (Signed)
Subjective:    CC: DM, HTN, Gout  HPI:  Diabetes - no hypoglycemic events. No wounds or sores that are not healing well. No increased thirst or urination. Checking glucose at home. Taking medications as prescribed without any side effects.  Hypertension- Pt denies chest pain, SOB, dizziness, or heart palpitations.  Taking meds as directed w/o problems.  Denies medication side effects.    Gout - Doing well with no recent flares.   Has a couple  Of skin lesion on his left upper back that he would like frozen today.   Past medical history, Surgical history, Family history not pertinant except as noted below, Social history, Allergies, and medications have been entered into the medical record, reviewed, and corrections made.   Review of Systems: No fevers, chills, night sweats, weight loss, chest pain, or shortness of breath.   Objective:    General: Well Developed, well nourished, and in no acute distress.  Neuro: Alert and oriented x3, extra-ocular muscles intact, sensation grossly intact.  HEENT: Normocephalic, atraumatic  Skin: Warm and dry, no rashes. Cardiac: Regular rate and rhythm, no murmurs rubs or gallops, no lower extremity edema.  Respiratory: Clear to auscultation bilaterally. Not using accessory muscles, speaking in full sentences.   Impression and Recommendations:    DM- On statin + ACE.  Foot exam performed today. Well controlled. Continue current regimen. Follow up in  3-4 months.  Lab Results  Component Value Date   HGBA1C 6.1 09/06/2016    HTN - Well controlled. Continue current regimen. Follow up in  3-4 months.   Gout  - WEll controlled.  Recheck URIC acid with next set of labs.   seb K on left upper back - cryotherapy performed. Asian tolerated well. Follow-up one care discussed.    Cryotherapy Procedure Note  Pre-operative Diagnosis: Seborrheic keratosis, x 5  Post-operative Diagnosis: same   Locations: left upper back over  shoulder  Indications: irritation  Anesthesia: not required    Procedure Details  Patient informed of risks (permanent scarring, infection, light or dark discoloration, bleeding, infection, weakness, numbness and recurrence of the lesion) and benefits of the procedure and verbal informed consent obtained.  The areas are treated with liquid nitrogen therapy, frozen until ice ball extended 1-2 mm beyond lesion, allowed to thaw, and treated again. The patient tolerated procedure well.  The patient was instructed on post-op care, warned that there may be blister formation, redness and pain. Recommend OTC analgesia as needed for pain.  Condition: Stable  Complications: none.  Plan: 1. Instructed to keep the area dry and covered for 24-48h and clean thereafter. 2. Warning signs of infection were reviewed.   3. Recommended that the patient use OTC acetaminophen as needed for pain.  4. Return in  PRN>

## 2016-11-22 ENCOUNTER — Other Ambulatory Visit: Payer: Self-pay | Admitting: Family Medicine

## 2016-11-29 ENCOUNTER — Other Ambulatory Visit: Payer: Self-pay | Admitting: Family Medicine

## 2016-12-20 ENCOUNTER — Ambulatory Visit (INDEPENDENT_AMBULATORY_CARE_PROVIDER_SITE_OTHER): Payer: Medicare Other | Admitting: Family Medicine

## 2016-12-20 ENCOUNTER — Encounter: Payer: Self-pay | Admitting: Family Medicine

## 2016-12-20 DIAGNOSIS — M898X1 Other specified disorders of bone, shoulder: Secondary | ICD-10-CM | POA: Insufficient documentation

## 2016-12-20 MED ORDER — TRAMADOL HCL 50 MG PO TABS: 50.0000 mg | ORAL_TABLET | Freq: Three times a day (TID) | ORAL | 0 refills | Status: DC | PRN

## 2016-12-20 NOTE — Progress Notes (Signed)
Aaron Horn is a 71 y.o. male who presents to [facility] today for right shoulder blade pain.  Patient reports pain around his right scapula for 3-4 weeks. He describes the pain as achy and 8/10 in severity. The pain is interfering with his sleep. Patient has not tried ibuprofen or tylenol. He has tried icing the area, but has not found this helpful. Applying Viveros has been helpful in alleviating the pain temporarily. 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 He does not recall any particular incident in which the pain began. Patient reports that pain increases when pushing down on bed when getting up in the morning.  Patient denies any recent illness, weight loss, fevers, chills, night sweats, numbness, or tingling.   Past Medical History:  Diagnosis Date  . AV block, 1st degree   . Dyslipidemia   . FH: colonic polyps   . Gout   . Herpes zoster   . HTN (hypertension)    benign essential  . Impaired fasting glucose    Past Surgical History:  Procedure Laterality Date  . COLONOSCOPY  2002   polyps   . tohphi removal foot  2000   Social History  Substance Use Topics  . Smoking status: Former Research scientist (life sciences)  . Smokeless tobacco: Never Used     Comment: quit smoking in 1998 after 20 pack yr hx  . Alcohol use Yes     Comment: 2 etoh a night      ROS:  As above   Medications: Current Outpatient Prescriptions  Medication Sig Dispense Refill  . allopurinol (ZYLOPRIM) 100 MG tablet TAKE 1 TABLET EVERY DAY 90 tablet 2  . AMBULATORY NON FORMULARY MEDICATION Medication Name: Glucometer, testing strips, and lancets.  Test once one day. 100 Units PRN  . AMBULATORY NON FORMULARY MEDICATION Medication Name: lancets  DX code: E11.9 type 2 DM 100 each prn  . aspirin (ASPIR-LOW) 81 MG EC tablet Take 81 mg by mouth daily.      . Cholecalciferol (VITAMIN D3 PO) Take 1 capsule by mouth. Taking once a  week    . Colchicine 0.6 MG CAPS 2 on Day 1, then one a day PRN gout flare.  Ok to sub generic or tabs if cheaper. 30 capsule 11  . fish oil-omega-3 fatty acids 1000 MG capsule Take 4 mg by mouth daily.     Marland Kitchen glucose blood (TRUE METRIX BLOOD GLUCOSE TEST) test strip USE AS DIRECTED TO TEST ONCE DAILY 100 each 12  . hydrochlorothiazide (HYDRODIURIL) 25 MG tablet TAKE 1 TABLET EVERY DAY 90 tablet 1  . ketoconazole (NIZORAL) 2 % shampoo Apply 1 application topically 2 (two) times a week. Use daily for one week. 120 mL 0  . lisinopril (PRINIVIL,ZESTRIL) 40 MG tablet Take 1 tablet (40 mg total) by mouth daily. 90 tablet 1  . metFORMIN (GLUCOPHAGE) 1000 MG tablet TAKE ONE TABLET BY MOUTH TWICE DAILY WITH A MEAL 90 tablet 2  . simvastatin (ZOCOR) 40 MG tablet TAKE 1 TABLET (40 MG TOTAL) BY MOUTH AT BEDTIME. 90 tablet 3  . traMADol (ULTRAM) 50 MG tablet Take 1 tablet (50 mg total) by mouth every 8 (eight) hours as needed. 15 tablet 0  . TRUEPLUS LANCETS 30G MISC USE AS DIRECTED TO TEST BLOOD SUGAR 100 each PRN   No current facility-administered medications for this visit.    No Known Allergies   Exam:  BP (!) 143/79  Pulse 65   Ht 6\' 1"  (1.854 m)   Wt 245 lb (111.1 kg)   BMI 32.32 kg/m  General: Well Developed, well nourished, and in no acute distress.  Neuro/Psych: Alert and oriented x3, extra-ocular muscles intact, able to move all 4 extremities, sensation grossly intact. Skin: Warm and dry, no rashes noted.  Respiratory: Not using accessory muscles, speaking in full sentences, trachea midline.  Cardiovascular: Pulses palpable, no extremity edema. Abdomen: Does not appear distended. MSK: Right scapula/shoulder: No gross deformity or swelling on inspection, no winging of scapula present No tenderness to palpation in shoulder.  Pain to palpation localized to inferior tip of scapula Range of motion is full with flexion and extension of the shoulder Strength is 5/5 with flexion and  extension of shoulder  Cspine: No gross deformity or swelling on inspection No tenderness to palpation along SCM or trapezius No decreased motion with rotation of head in either direction, neck flexion and extension full range of motion   No results found for this or any previous visit (from the past 48 hour(s)). No results found.    Assessment and Plan: 71 y.o. male with right shoulder blade pain. Given patient's presentation and physical exam, this is most likely a musculoskeletal problem. Conservative measures such as heating pads, TENS unit, and tylenol were discussed with the patient. The importance of PT was also discussed. Patient was given a prescription for tramadol to use at night for help alleviate the pain enough so that he can sleep. Patient will follow-up in clinic is symptoms do not improve or worsen.    Orders Placed This Encounter  Procedures  . Ambulatory referral to Physical Therapy    Referral Priority:   Routine    Referral Type:   Physical Medicine    Referral Reason:   Specialty Services Required    Requested Specialty:   Physical Therapy   Meds ordered this encounter  Medications  . traMADol (ULTRAM) 50 MG tablet    Sig: Take 1 tablet (50 mg total) by mouth every 8 (eight) hours as needed.    Dispense:  15 tablet    Refill:  0    Discussed warning signs or symptoms. Please see discharge instructions. Patient expresses understanding.  Patient researched [facility] Controlled Substance Reporting System.

## 2016-12-20 NOTE — Patient Instructions (Signed)
Thank you for coming in today.  Attend PT.   Use a heating pad and TENS unit.   Use tramadol sparingly.   Come back or go to the emergency room if you notice new weakness new numbness problems walking or bowel or bladder problems.  TENS UNIT: This is helpful for muscle pain and spasm.   Search and Purchase a TENS 7000 2nd edition at  www.tenspros.com or www.[employer].com It should be less than $30.     TENS unit instructions: Do not shower or bathe with the unit on Turn the unit off before removing electrodes or batteries If the electrodes lose stickiness add a drop of water to the electrodes after they are disconnected from the unit and place on plastic sheet. If you continued to have difficulty, call the TENS unit company to purchase more electrodes. Do not apply lotion on the skin area prior to use. Make sure the skin is clean and dry as this will help prolong the life of the electrodes. After use, always check skin for unusual red areas, rash or other skin difficulties. If there are any skin problems, does not apply electrodes to the same area. Never remove the electrodes from the unit by pulling the wires. Do not use the TENS unit or electrodes other than as directed. Do not change electrode placement without consultating your therapist or physician. Keep 2 fingers with between each electrode. Wear time ratio is 2:1, on to off times.    For example on for 30 minutes off for 15 minutes and then on for 30 minutes off for 15 minutes

## 2016-12-24 ENCOUNTER — Other Ambulatory Visit: Payer: Self-pay | Admitting: Family Medicine

## 2017-01-10 ENCOUNTER — Encounter: Payer: Self-pay | Admitting: Family Medicine

## 2017-01-10 ENCOUNTER — Ambulatory Visit (INDEPENDENT_AMBULATORY_CARE_PROVIDER_SITE_OTHER): Payer: Medicare Other | Admitting: Family Medicine

## 2017-01-10 VITALS — BP 138/73 | HR 91 | Wt 243.0 lb

## 2017-01-10 DIAGNOSIS — L821 Other seborrheic keratosis: Secondary | ICD-10-CM | POA: Diagnosis not present

## 2017-01-10 DIAGNOSIS — S91111A Laceration without foreign body of right great toe without damage to nail, initial encounter: Secondary | ICD-10-CM

## 2017-01-10 DIAGNOSIS — E1169 Type 2 diabetes mellitus with other specified complication: Secondary | ICD-10-CM

## 2017-01-10 DIAGNOSIS — I10 Essential (primary) hypertension: Secondary | ICD-10-CM | POA: Diagnosis not present

## 2017-01-10 DIAGNOSIS — E669 Obesity, unspecified: Secondary | ICD-10-CM | POA: Diagnosis not present

## 2017-01-10 DIAGNOSIS — M1A09X1 Idiopathic chronic gout, multiple sites, with tophus (tophi): Secondary | ICD-10-CM | POA: Diagnosis not present

## 2017-01-10 LAB — POCT GLYCOSYLATED HEMOGLOBIN (HGB A1C): Hemoglobin A1C: 6

## 2017-01-10 MED ORDER — KETOCONAZOLE 2 % EX SHAM: 1.0000 "application " | MEDICATED_SHAMPOO | CUTANEOUS | 3 refills | Status: DC

## 2017-01-10 NOTE — Progress Notes (Signed)
Subjective:    CC: DM, HTN   HPI:  Diabetes - no hypoglycemic events. No wounds or sores that are not healing well. No increased thirst or urination. Checking glucose at home. Taking medications as prescribed without any side effects.He did hit the bottom of his right great toeon the threshold at the barber shop yesterday and cut it. He's been keeping an eye on it has been covering it with a Band-Aid.  Hypertension- Pt denies chest pain, SOB, dizziness, or heart palpitations.  Taking meds as directed w/o problems.  Denies medication side effects.    Gout - dong well with no recentl flares  He also has a lesion on his left shin and upper chest  The sternoclavicular notch that he would like frozen off today.  Past medical history, Surgical history, Family history not pertinant except as noted below, Social history, Allergies, and medications have been entered into the medical record, reviewed, and corrections made.   Review of Systems: No fevers, chills, night sweats, weight loss, chest pain, or shortness of breath.   Objective:    General: Well Developed, well nourished, and in no acute distress.  Neuro: Alert and oriented x3, extra-ocular muscles intact, sensation grossly intact.  HEENT: Normocephalic, atraumatic  Skin: Warm and dry, no rashes. Does have a laceration to the bottom crease of the great toe on the right foot. On the upper chest near the sternoclavicular notch and on the left shin he haseic keratosis. The  On the shin is approximately 6-7 mm. The one on his upper chest is much larger approximately 1/2 cm in size. Cardiac: Regular rate and rhythm, no murmurs rubs or gallops, no lower extremity edema.  Respiratory: Clear to auscultation bilaterally. Not using accessory muscles, speaking in full sentences.   Impression and Recommendations:    DM - Well controlled. Continue current regimen. Follow up in  4 months. A1C of 6.0.   HTN -  Well controlled. Continue current  regimen. Follow up in  4 months.    Gout - stable. No recent flares in fact he's doing well. Due to recheck uric acid level.  Seborrheic keratoses on left shin and upper chest. Patient tolerated cryotherapy well  Great toe laceration-continue to monitor for any signs of infection. Were happy to look at it if it looks worrir if it's not healing well. Did encourage him to keep it moisturized with Vaseline.  Cryotherapy Procedure Note  Pre-operative Diagnosis: seb keratosis  Post-operative Diagnosis: seborrheic keratosis  Locations: left ant lower shin, and upper chest near sternoclavicular notch.    Indications: irritation  Anesthesia: not required    Procedure Details  Patient informed of risks (permanent scarring, infection, light or dark discoloration, bleeding, infection, weakness, numbness and recurrence of the lesion) and benefits of the procedure and verbal informed consent obtained.  The areas are treated with liquid nitrogen therapy, frozen until ice ball extended 2-3 mm beyond lesion, allowed to thaw, and treated again. The patient tolerated procedure well.  The patient was instructed on post-op care, warned that there may be blister formation, redness and pain. Recommend OTC analgesia as needed for pain.  Condition: Stable  Complications: none.  Plan: 1. Instructed to keep the area dry and covered for 24-48h and clean thereafter. 2. Warning signs of infection were reviewed.   3. Recommended that the patient use OTC acetaminophen as needed for pain.  4. Return in PRN.

## 2017-01-17 ENCOUNTER — Ambulatory Visit (INDEPENDENT_AMBULATORY_CARE_PROVIDER_SITE_OTHER): Payer: Medicare Other | Admitting: Family Medicine

## 2017-01-17 VITALS — BP 126/60 | HR 67 | Temp 98.0°F | Ht 72.5 in | Wt 245.3 lb

## 2017-01-17 DIAGNOSIS — Z23 Encounter for immunization: Secondary | ICD-10-CM

## 2017-01-17 NOTE — Progress Notes (Signed)
   Subjective:    Patient ID: Aaron Horn, male    DOB: 04-24-1946, 71 y.o.   MRN: [id number]  HPI Pt is here for flu vaccine. Pt denies egg allergy, fever, chest pain, SOB, or any other problems.   Review of Systems     Objective:   Physical Exam        Assessment & Plan:  Pt tolerated injection in Left deltoid well and without any complications.

## 2017-02-13 ENCOUNTER — Other Ambulatory Visit: Payer: Self-pay | Admitting: Family Medicine

## 2017-02-15 ENCOUNTER — Other Ambulatory Visit: Payer: Self-pay | Admitting: Family Medicine

## 2017-02-19 ENCOUNTER — Other Ambulatory Visit: Payer: Self-pay | Admitting: Physician Assistant

## 2017-02-21 ENCOUNTER — Other Ambulatory Visit: Payer: Self-pay | Admitting: Family Medicine

## 2017-03-08 LAB — HM DIABETES EYE EXAM

## 2017-03-18 ENCOUNTER — Encounter: Payer: Self-pay | Admitting: Family Medicine

## 2017-03-18 ENCOUNTER — Other Ambulatory Visit: Payer: Self-pay | Admitting: Family Medicine

## 2017-04-05 ENCOUNTER — Other Ambulatory Visit: Payer: Self-pay | Admitting: Family Medicine

## 2017-04-30 ENCOUNTER — Ambulatory Visit: Payer: Medicare Other | Admitting: Family Medicine

## 2017-04-30 ENCOUNTER — Encounter: Payer: Self-pay | Admitting: Family Medicine

## 2017-04-30 ENCOUNTER — Ambulatory Visit (INDEPENDENT_AMBULATORY_CARE_PROVIDER_SITE_OTHER): Payer: Medicare Other

## 2017-04-30 VITALS — BP 139/85 | HR 68 | Ht 72.0 in | Wt 248.0 lb

## 2017-04-30 DIAGNOSIS — M79671 Pain in right foot: Secondary | ICD-10-CM

## 2017-04-30 DIAGNOSIS — M7731 Calcaneal spur, right foot: Secondary | ICD-10-CM

## 2017-04-30 NOTE — Progress Notes (Signed)
Aaron Horn is a 71 y.o. male who presents to [facility] today for right foot pain.  Been notes pain in his right lateral foot.  It has been present for about a month and has occurred without injury.  The pain comes and goes but is worse with activity better with rest.  He cannot recall any specific change in activity.  He has not tried much treatment yet.  He notes the pain is somewhat bothersome and interferes with his ability to walk normally.  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   Past Medical History:  Diagnosis Date  . AV block, 1st degree   . Dyslipidemia   . FH: colonic polyps   . Gout   . Herpes zoster   . HTN (hypertension)    benign essential  . Impaired fasting glucose    Past Surgical History:  Procedure Laterality Date  . COLONOSCOPY  2002   polyps   . tohphi removal foot  2000   Social History   Tobacco Use  . Smoking status: Former Research scientist (life sciences)  . Smokeless tobacco: Never Used  . Tobacco comment: quit smoking in 1998 after 20 pack yr hx  Substance Use Topics  . Alcohol use: Yes    Comment: 2 etoh a night      ROS:  As above   Medications: Current Outpatient Medications  Medication Sig Dispense Refill  . allopurinol (ZYLOPRIM) 100 MG tablet TAKE ONE TABLET BY MOUTH ONE TIME DAILY  90 tablet 1  . AMBULATORY NON FORMULARY MEDICATION Medication Name: Glucometer, testing strips, and lancets.  Test once one day. 100 Units PRN  . AMBULATORY NON FORMULARY MEDICATION Medication Name: lancets  DX code: E11.9 type 2 DM 100 each prn  . aspirin (ASPIR-LOW) 81 MG EC tablet Take 81 mg by mouth daily.      . Cholecalciferol (VITAMIN D3 PO) Take 1 capsule by mouth. Taking once a week    . Colchicine 0.6 MG CAPS 2 on Day 1, then one a day PRN gout flare.  Ok to sub generic or tabs if cheaper. 30 capsule 11  . fish oil-omega-3 fatty acids 1000 MG capsule Take 4 mg by mouth daily.     Marland Kitchen glucose blood (TRUE METRIX  BLOOD GLUCOSE TEST) test strip USE AS DIRECTED TO TEST ONCE DAILY 100 each 12  . hydrochlorothiazide (HYDRODIURIL) 25 MG tablet TAKE ONE TABLET BY MOUTH ONE TIME DAILY (NEEDS APPOINTMENT FOR FURTHER REFILLS) 90 tablet 0  . ketoconazole (NIZORAL) 2 % shampoo Apply 1 application topically 2 (two) times a week. Use daily for one week. 120 mL 3  . lisinopril (PRINIVIL,ZESTRIL) 40 MG tablet TAKE ONE TABLET BY MOUTH ONE TIME DAILY  90 tablet 0  . metFORMIN (GLUCOPHAGE) 1000 MG tablet TAKE ONE TABLET BY MOUTH TWICE DAILY WITH A MEAL 90 tablet 1  . simvastatin (ZOCOR) 40 MG tablet TAKE 1 TABLET (40 MG TOTAL) BY MOUTH AT BEDTIME. 90 tablet 2  . TRUEPLUS LANCETS 30G MISC USE AS DIRECTED TO TEST BLOOD SUGAR 100 each PRN   No current facility-administered medications for this visit.    No Known Allergies   Exam:  BP 139/85   Pulse 68   Ht 6' (1.829 m)   Wt 248 lb (112.5 kg)   BMI 33.63 kg/m  General: Well Developed, well nourished, and in no acute distress.  Neuro/Psych: Alert and oriented x3, extra-ocular muscles intact, able to move all 4  extremities, sensation grossly intact. Skin: Warm and dry, no rashes noted.  Respiratory: Not using accessory muscles, speaking in full sentences, trachea midline.  Cardiovascular: Pulses palpable, no extremity edema. Abdomen: Does not appear distended. MSK: Right foot unremarkable appearing with no erythema or swelling.  Not particularly tender along the fourth or fifth metatarsals.  Normal motion.  Pulses capillary refill and sensation are intact.  X-ray right foot: Mild degenerative changes.  No fracture or stress fracture present. Formal radiology review pending    No results found for this or any previous visit (from the past 48 hour(s)). No results found.    Assessment and Plan: 71 y.o. male with right foot pain without clear etiology.  Stress fracture is a possibility.  Plan for immobilization with a postop shoe for 4-6 weeks.  Recheck in 4-6  weeks.  Return sooner if worsening or not improving.    Orders Placed This Encounter  Procedures  . DG Foot Complete Right    Standing Status:   Future    Number of Occurrences:   1    Standing Expiration Date:   07/01/2018    Order Specific Question:   Reason for Exam (SYMPTOM  OR DIAGNOSIS REQUIRED)    Answer:   eval pain 5th metatarsal    Order Specific Question:   Preferred imaging location?    Answer:   [facility]    Order Specific Question:   Radiology Contrast Protocol - do NOT remove file path    Answer:   [email or web address]   No orders of the defined types were placed in this encounter.   Discussed warning signs or symptoms. Please see discharge instructions. Patient expresses understanding.  I spent 25 minutes with this patient, greater than 50% was face-to-face time counseling regarding ddx and treatment plan.

## 2017-04-30 NOTE — Patient Instructions (Signed)
Thank you for coming in today. Use the post op shoe for 6 weeks or so.  Recheck in 4-6 weeks.  Return sooner if needed.     Stress Fracture Stress fracture is a small break or crack in a bone. A stress fracture can be fully broken (complete) or partially broken (incomplete). The most common sites for stress fractures are the bones in the front of your feet (metatarsals), your heels (calcaneus), and the long bone of your lower leg (tibia). What are the causes? A stress fracture is caused by overuse or repetitive exercise, such as running. It happens when a bone cannot absorb any more shock because the muscles around it are weak. Stress fractures happen most commonly when:  You rapidly increase or start a new physical activity.  You use shoes that are worn out or do not fit you properly.  You exercise on a new surface.  What increases the risk? You may be at higher risk for this type of fracture if:  You have a condition that causes weak bones (osteoporosis).  You are male. Stress fractures are more likely to occur in women.  What are the signs or symptoms? The most common symptom of a stress fracture is feeling pain when you are using the affected part of your body. The pain usually goes away when you are resting. Other symptoms may include:  Swelling of the affected area.  Pain in the area when it is touched.  Decreased pain while resting.  Stress fracture pain usually develops over time. How is this diagnosed? Diagnosis may include:  Medical history and physical exam.  X-rays.  Bone scan.  MRI.  How is this treated? Treatment depends on the severity of your stress fracture. Treatment usually involves resting, icing, compression, and elevation (RICE) of the affected part of your body. Treatment may also include:  Medicines to reduce inflammation.  A cast or a walking shoe.  Crutches.  Surgery.  Follow these instructions at home: If you have a cast:  Do  not stick anything inside the cast to scratch your skin. Doing that increases your risk of infection.  Check the skin around the cast every day. Report any concerns to your health care provider. You may put lotion on dry skin around the edges of the cast. Do not apply lotion to the skin underneath the cast.  Keep the cast clean and dry.  Cover the cast with a watertight plastic bag to protect it from water while you take a bath or a shower. Do not let the cast get wet.  Do not put pressure on any part of the cast until it is fully hardened. This may take several hours. If You Have a Walking Shoe:   Wear it as directed by your health care provider. Managing pain, stiffness, and swelling  If directed, apply ice to the injured area: ? Put ice in a plastic bag. ? Place a towel between your skin and the bag. ? Leave the ice on for 20 minutes, 2-3 times per day.  Move your fingers or toes often to avoid stiffness and to lessen swelling.  Raise the injured area above the level of your heart while you are sitting or lying down. Activity  Rest as directed by your health care provider. Ask your health care provider if you may do alternative exercises, such as swimming or biking, while you are healing.  Return to your normal activities as directed by your health care provider. Ask your health  care provider what activities are safe for you.  Perform range-of-motion exercises only as directed by your health care provider. Safety  Do not use the injured limb to support yourbody weight until your health care provider says that you can. Use crutches if your health care provider tells you to do so. General instructions  Do not use any tobacco products, including cigarettes, chewing tobacco, or electronic cigarettes. Tobacco can delay bone healing. If you need help quitting, ask your health care provider.  Take medicines only as directed by your health care provider.  Keep all follow-up visits  as directed by your health care provider. This is important. How is this prevented?  Only wear shoes that: ? Fit well. ? Are not worn out.  Eat a healthy diet that contains vitamin D and calcium. This helps keeps your bones strong.  Be careful when you start a new physical activity. Give your body time to adjust.  Avoid doing only one kind of activity. Do different exercises, such as swimming and running, so that no single part of your body gets overused.  Do strength-training exercises. Contact a health care provider if:  Your pain gets worse.  You have new symptoms.  You have increased swelling. Get help right away if:  You lose feeling in the affected area. This information is not intended to replace advice given to you by your health care provider. Make sure you discuss any questions you have with your health care provider. Document Released: 07/21/2002 Document Revised: 12/28/2015 Document Reviewed: 12/03/2013 Elsevier Interactive Patient Education  [employer].

## 2017-05-16 ENCOUNTER — Ambulatory Visit (INDEPENDENT_AMBULATORY_CARE_PROVIDER_SITE_OTHER): Payer: Medicare Other | Admitting: Family Medicine

## 2017-05-16 ENCOUNTER — Encounter: Payer: Self-pay | Admitting: Family Medicine

## 2017-05-16 VITALS — BP 128/78 | HR 56 | Ht 72.0 in | Wt 248.0 lb

## 2017-05-16 DIAGNOSIS — E1169 Type 2 diabetes mellitus with other specified complication: Secondary | ICD-10-CM | POA: Diagnosis not present

## 2017-05-16 DIAGNOSIS — E669 Obesity, unspecified: Secondary | ICD-10-CM

## 2017-05-16 DIAGNOSIS — E785 Hyperlipidemia, unspecified: Secondary | ICD-10-CM

## 2017-05-16 DIAGNOSIS — I10 Essential (primary) hypertension: Secondary | ICD-10-CM | POA: Diagnosis not present

## 2017-05-16 DIAGNOSIS — Z125 Encounter for screening for malignant neoplasm of prostate: Secondary | ICD-10-CM | POA: Diagnosis not present

## 2017-05-16 LAB — POCT GLYCOSYLATED HEMOGLOBIN (HGB A1C): Hemoglobin A1C: 6.6

## 2017-05-16 NOTE — Progress Notes (Signed)
Subjective:    CC: DM, BP check   HPI:  Diabetes - no hypoglycemic events. No wounds or sores that are not healing well. No increased thirst or urination. Checking glucose at home. Taking medications as prescribed without any side effects.  Hypertension- Pt denies chest pain, SOB, dizziness, or heart palpitations.  Taking meds as directed w/o problems.  Denies medication side effects.    Dyslipidemia -due to recheck lipid panel.  Currently on simvastatin 40 mg tolerating well without any side effects or myalgias.  Past medical history, Surgical history, Family history not pertinant except as noted below, Social history, Allergies, and medications have been entered into the medical record, reviewed, and corrections made.   Review of Systems: No fevers, chills, night sweats, weight loss, chest pain, or shortness of breath.   Objective:    General: Well Developed, well nourished, and in no acute distress.  Neuro: Alert and oriented x3, extra-ocular muscles intact, sensation grossly intact.  HEENT: Normocephalic, atraumatic  Skin: Warm and dry, no rashes. Cardiac: Regular rate and rhythm, no murmurs rubs or gallops, no lower extremity edema.  Respiratory: Clear to auscultation bilaterally. Not using accessory muscles, speaking in full sentences.   Impression and Recommendations:    DM-still well controlled though up from previous.  Heme globin A1c of 6.6 today.  But he admits he has been traveling and eating things out of the norm for him.  He plans on getting back on track.   HTN - Well controlled. Continue current regimen. Follow up in  6 months.    Dyslipidemia -recheck lipid panel.  Lab slip provided.  Due for PSA yearly.

## 2017-05-17 LAB — COMPLETE METABOLIC PANEL WITH GFR
AG Ratio: 2.6 (calc) — ABNORMAL HIGH (ref 1.0–2.5)
ALKALINE PHOSPHATASE (APISO): 53 U/L (ref 40–115)
ALT: 53 U/L — AB (ref 9–46)
AST: 41 U/L — AB (ref 10–35)
Albumin: 4.6 g/dL (ref 3.6–5.1)
BILIRUBIN TOTAL: 0.4 mg/dL (ref 0.2–1.2)
BUN: 17 mg/dL (ref 7–25)
CO2: 29 mmol/L (ref 20–32)
Calcium: 9.5 mg/dL (ref 8.6–10.3)
Chloride: 100 mmol/L (ref 98–110)
Creat: 0.93 mg/dL (ref 0.70–1.18)
GFR, Est African American: 94 mL/min/{1.73_m2} (ref >=60)
GFR, Est Non African American: 81 mL/min/{1.73_m2} (ref >=60)
GLUCOSE: 145 mg/dL — AB (ref 65–99)
Globulin: 1.8 g/dL (calc) — ABNORMAL LOW (ref 1.9–3.7)
Potassium: 4.5 mmol/L (ref 3.5–5.3)
Sodium: 137 mmol/L (ref 135–146)
TOTAL PROTEIN: 6.4 g/dL (ref 6.1–8.1)

## 2017-05-17 LAB — LIPID PANEL
CHOLESTEROL: 176 mg/dL (ref <200)
HDL: 45 mg/dL (ref >40)
LDL CHOLESTEROL (CALC): 103 mg/dL — AB
Non-HDL Cholesterol (Calc): 131 mg/dL (calc) — ABNORMAL HIGH (ref <130)
TRIGLYCERIDES: 167 mg/dL — AB (ref <150)
Total CHOL/HDL Ratio: 3.9 (calc) (ref <5.0)

## 2017-05-17 LAB — PSA: PSA: 3.5 ng/mL (ref <=4.0)

## 2017-05-20 ENCOUNTER — Other Ambulatory Visit: Payer: Self-pay | Admitting: Family Medicine

## 2017-06-04 ENCOUNTER — Ambulatory Visit (INDEPENDENT_AMBULATORY_CARE_PROVIDER_SITE_OTHER): Payer: Medicare Other | Admitting: Family Medicine

## 2017-06-04 ENCOUNTER — Encounter: Payer: Self-pay | Admitting: Family Medicine

## 2017-06-04 VITALS — BP 170/79 | HR 67 | Ht 72.5 in | Wt 249.0 lb

## 2017-06-04 DIAGNOSIS — M79671 Pain in right foot: Secondary | ICD-10-CM

## 2017-06-04 NOTE — Progress Notes (Signed)
Aaron Horn is a 72 y.o. male who presents to [facility] today for right foot pain.  Been was seen about 5 weeks ago for right lateral foot pain thought to be possibly due to stress injury.  He has been treated conservatively with a postop shoe and notes that his pain is about the same.  The pain is worse with prolonged standing and better with rest.  He has not tried much treatment besides the shoe.  He denies any injury   Past Medical History:  Diagnosis Date  . AV block, 1st degree   . Dyslipidemia   . FH: colonic polyps   . Gout   . Herpes zoster   . HTN (hypertension)    benign essential  . Impaired fasting glucose    Past Surgical History:  Procedure Laterality Date  . COLONOSCOPY  2002   polyps   . tohphi removal foot  2000   Social History   Tobacco Use  . Smoking status: Former Research scientist (life sciences)  . Smokeless tobacco: Never Used  . Tobacco comment: quit smoking in 1998 after 20 pack yr hx  Substance Use Topics  . Alcohol use: Yes    Comment: 2 etoh a night      ROS:  As above   Medications: Current Outpatient Medications  Medication Sig Dispense Refill  . allopurinol (ZYLOPRIM) 100 MG tablet TAKE ONE TABLET BY MOUTH ONE TIME DAILY  90 tablet 1  . AMBULATORY NON FORMULARY MEDICATION Medication Name: Glucometer, testing strips, and lancets.  Test once one day. 100 Units PRN  . AMBULATORY NON FORMULARY MEDICATION Medication Name: lancets  DX code: E11.9 type 2 DM 100 each prn  . aspirin (ASPIR-LOW) 81 MG EC tablet Take 81 mg by mouth daily.      . Cholecalciferol (VITAMIN D3 PO) Take 1 capsule by mouth. Taking once a week    . Colchicine 0.6 MG CAPS 2 on Day 1, then one a day PRN gout flare.  Ok to sub generic or tabs if cheaper. 30 capsule 11  . fish oil-omega-3 fatty acids 1000 MG capsule Take 4 mg by mouth daily.     Marland Kitchen glucose blood (TRUE METRIX BLOOD GLUCOSE TEST) test strip USE AS DIRECTED TO TEST ONCE DAILY 100  each 12  . hydrochlorothiazide (HYDRODIURIL) 25 MG tablet TAKE ONE TABLET BY MOUTH ONE TIME DAILY (NEEDS APPOINTMENT FOR FURTHER REFILLS) 90 tablet 0  . ketoconazole (NIZORAL) 2 % shampoo Apply 1 application topically 2 (two) times a week. Use daily for one week. 120 mL 3  . lisinopril (PRINIVIL,ZESTRIL) 40 MG tablet TAKE ONE TABLET BY MOUTH ONE TIME DAILY  90 tablet 1  . metFORMIN (GLUCOPHAGE) 1000 MG tablet TAKE ONE TABLET BY MOUTH TWICE DAILY WITH A MEAL 90 tablet 1  . simvastatin (ZOCOR) 40 MG tablet TAKE 1 TABLET (40 MG TOTAL) BY MOUTH AT BEDTIME. 90 tablet 2  . TRUEPLUS LANCETS 30G MISC USE AS DIRECTED TO TEST BLOOD SUGAR 100 each PRN   No current facility-administered medications for this visit.    No Known Allergies   Exam:  BP (!) 170/79   Pulse 67   Ht 6' 0.5" (1.842 m)   Wt 249 lb (112.9 kg)   BMI 33.31 kg/m  General: Well Developed, well nourished, and in no acute distress.  Neuro/Psych: Alert and oriented x3, extra-ocular muscles intact, able to move all 4 extremities, sensation grossly intact. Skin: Warm and dry, no rashes noted.  Respiratory: Not using accessory muscles, speaking in full sentences, trachea midline.  Cardiovascular: Pulses palpable, no extremity edema. Abdomen: Does not appear distended. MSK: Right foot normal-appearing.  Mildly tender to palpation fifth metatarsal. Normal foot motion.  Pulses capillary refill and sensation are intact.  X-ray right foot reviewed    No results found for this or any previous visit (from the past 48 hour(s)). No results found.    Assessment and Plan: 72 y.o. male with persistent right foot pain unclear etiology.  Likely over pressure or stress injury.  This could also be peripheral neuropathy related although that is unlikely.  Plan for orthotics and recheck in the near future.    No orders of the defined types were placed in this encounter.  No orders of the defined types were placed in this  encounter.   Discussed warning signs or symptoms. Please see discharge instructions. Patient expresses understanding.

## 2017-06-04 NOTE — Patient Instructions (Signed)
Thank you for coming in today. Schedule for orthotics in a few week . Let do Mid February.  Be sure to bring a variety of your shoes so I can fit the orthotics to your shoes.   Recheck sooner if needed.

## 2017-06-18 ENCOUNTER — Other Ambulatory Visit: Payer: Self-pay | Admitting: Family Medicine

## 2017-06-25 ENCOUNTER — Encounter: Payer: Self-pay | Admitting: Family Medicine

## 2017-06-25 ENCOUNTER — Ambulatory Visit (INDEPENDENT_AMBULATORY_CARE_PROVIDER_SITE_OTHER): Payer: Medicare Other | Admitting: Family Medicine

## 2017-06-25 VITALS — BP 146/76 | HR 64 | Temp 98.2°F | Resp 16 | Wt 250.0 lb

## 2017-06-25 DIAGNOSIS — M79671 Pain in right foot: Secondary | ICD-10-CM

## 2017-06-25 NOTE — Progress Notes (Signed)
    Orthotics Note:   Patient was fitted for a : standard, cushioned, semi-rigid orthotic. The orthotic was heated and afterward the patient stood on the orthotic blank positioned on the orthotic stand. The patient was positioned in subtalar neutral position and 10 degrees of ankle dorsiflexion in a weight bearing stance. After completion of molding, a stable base was applied to the orthotic blank. The blank was ground to a stable position for weight bearing. Size: 13 trimmed slightly short and narrow Base: [employer] and safety inspector and Padding: None The patient ambulated these, and they were very comfortable.  I spent 40 minutes with this patient independent of orthotic preparation, greater than 50% was face-to-face time counseling regarding risks and benefits of orthotics expected side effects in addition to what type of shoes or compatible with semirigid orthotics as well as expected lifetime of orthotics.

## 2017-06-25 NOTE — Patient Instructions (Signed)
Thank you for coming in today. Recheck as needed.  Use orthotics in foot wear.

## 2017-07-06 ENCOUNTER — Other Ambulatory Visit: Payer: Self-pay | Admitting: Family Medicine

## 2017-08-12 ENCOUNTER — Other Ambulatory Visit: Payer: Self-pay | Admitting: Family Medicine

## 2017-08-15 ENCOUNTER — Encounter: Payer: Self-pay | Admitting: Family Medicine

## 2017-08-15 ENCOUNTER — Ambulatory Visit (INDEPENDENT_AMBULATORY_CARE_PROVIDER_SITE_OTHER): Payer: Medicare Other | Admitting: Family Medicine

## 2017-08-15 VITALS — BP 133/68 | HR 68 | Ht 73.0 in | Wt 247.0 lb

## 2017-08-15 DIAGNOSIS — R093 Abnormal sputum: Secondary | ICD-10-CM

## 2017-08-15 DIAGNOSIS — E669 Obesity, unspecified: Secondary | ICD-10-CM | POA: Diagnosis not present

## 2017-08-15 DIAGNOSIS — R748 Abnormal levels of other serum enzymes: Secondary | ICD-10-CM | POA: Diagnosis not present

## 2017-08-15 DIAGNOSIS — E1169 Type 2 diabetes mellitus with other specified complication: Secondary | ICD-10-CM

## 2017-08-15 DIAGNOSIS — I10 Essential (primary) hypertension: Secondary | ICD-10-CM | POA: Diagnosis not present

## 2017-08-15 DIAGNOSIS — R351 Nocturia: Secondary | ICD-10-CM | POA: Diagnosis not present

## 2017-08-15 LAB — POCT UA - MICROALBUMIN
CREATININE, POC: 100 mg/dL
Microalbumin Ur, POC: 30 mg/L

## 2017-08-15 LAB — POCT GLYCOSYLATED HEMOGLOBIN (HGB A1C): Hemoglobin A1C: 6.6

## 2017-08-15 NOTE — Progress Notes (Signed)
Subjective:    CC: DM, HTN  HPI:  Diabetes - no hypoglycemic events. No wounds or sores that are not healing well. No increased thirst or urination. Checking glucose at home. Taking medications as prescribed without any side effects.  Hypertension- Pt denies chest pain, SOB, dizziness, or heart palpitations.  Taking meds as directed w/o problems.  Denies medication side effects.    Elevated liver enzymes -liver enzyme were elevated in Januar He also reports that he had black thick mucus that he was coughing up about 2 months ago.  At that time he felt like it was actually coming from his sinuses.  He has not seen any since then but did bring in a sample for me to examine today.  He also reports that he is been getting up at night to urinate a little bit more frequently.  Usually once or twice at bedtime and it does wake him up.  No major symptoms during the daytime.  No burning or dysuria.  No hematuria.  Last PSA in January was normal.   Past medical history, Surgical history, Family history not pertinant except as noted below, Social history, Allergies, and medications have been entered into the medical record, reviewed, and corrections made.   Review of Systems: No fevers, chills, night sweats, weight loss, chest pain, or shortness of breath.   Objective:    General: Well Developed, well nourished, and in no acute distress.  Neuro: Alert and oriented x3, extra-ocular muscles intact, sensation grossly intact.  HEENT: Normocephalic, atraumatic  Skin: Warm and dry, no rashes. Cardiac: Regular rate and rhythm, no murmurs rubs or gallops, no lower extremity edema.  Respiratory: Clear to auscultation bilaterally. Not using accessory muscles, speaking in full sentences.   Impression and Recommendations:    HTN - Well controlled. Continue current regimen. Follow up in  4 months.   DM - Well controlled. Continue current regimen. Follow up in  4 months.    Elevated liver enzymes on  recent labs in January.  At this point I would recommend an ultrasound since it has been persistently elevated for a year.  Recommend ultrasound with elastography.  Suspect fatty liver as diagnosis.  Nocturia-suspect probably some prostate enlargement.  Normal PSA.  He said at this point is not interested in medication or treatment but will keep an eye on it and let me know if he gets worse.  Black thick colored mucus-it seems to have resolved and he has not had any problems numbers 2 months so just encouraged him to come back and if it occurs again so that we can evaluate him further.  I suspect he may have had a sinus infection that resolved on its own.

## 2017-08-26 ENCOUNTER — Other Ambulatory Visit: Payer: Self-pay | Admitting: Family Medicine

## 2017-11-15 ENCOUNTER — Other Ambulatory Visit: Payer: Self-pay | Admitting: Family Medicine

## 2017-11-28 ENCOUNTER — Other Ambulatory Visit: Payer: Self-pay | Admitting: Family Medicine

## 2017-12-13 ENCOUNTER — Other Ambulatory Visit: Payer: Self-pay | Admitting: Family Medicine

## 2017-12-16 ENCOUNTER — Ambulatory Visit: Payer: Medicare Other | Admitting: Family Medicine

## 2017-12-17 ENCOUNTER — Other Ambulatory Visit: Payer: Self-pay | Admitting: *Deleted

## 2017-12-17 MED ORDER — METFORMIN HCL 1000 MG PO TABS: 1000.0000 mg | ORAL_TABLET | Freq: Two times a day (BID) | ORAL | 2 refills | Status: DC

## 2017-12-31 ENCOUNTER — Encounter: Payer: Self-pay | Admitting: Family Medicine

## 2017-12-31 ENCOUNTER — Ambulatory Visit (INDEPENDENT_AMBULATORY_CARE_PROVIDER_SITE_OTHER): Payer: Medicare Other | Admitting: Family Medicine

## 2017-12-31 VITALS — BP 136/67 | HR 62 | Ht 73.0 in | Wt 249.0 lb

## 2017-12-31 DIAGNOSIS — I10 Essential (primary) hypertension: Secondary | ICD-10-CM

## 2017-12-31 DIAGNOSIS — Z23 Encounter for immunization: Secondary | ICD-10-CM

## 2017-12-31 DIAGNOSIS — E669 Obesity, unspecified: Secondary | ICD-10-CM | POA: Diagnosis not present

## 2017-12-31 DIAGNOSIS — E1169 Type 2 diabetes mellitus with other specified complication: Secondary | ICD-10-CM | POA: Diagnosis not present

## 2017-12-31 LAB — POCT GLYCOSYLATED HEMOGLOBIN (HGB A1C): Hemoglobin A1C: 6.8 % — AB (ref 4.0–5.6)

## 2017-12-31 MED ORDER — HYDROCHLOROTHIAZIDE 25 MG PO TABS: 25.0000 mg | ORAL_TABLET | Freq: Every day | ORAL | 3 refills | Status: DC

## 2017-12-31 NOTE — Progress Notes (Signed)
Subjective:    CC: DM, BP  HPI:  Diabetes - no hypoglycemic events. No wounds or sores that are not healing well. No increased thirst or urination. Checking glucose at home.  Home blood sugars running between 135 and 155.  He admits he could probably be doing a little bit better with his dietary choices.  He does take his medication regularly.  Taking medications as prescribed without any side effects.  Hypertension- Pt denies chest pain, SOB, dizziness, or heart palpitations.  Taking meds as directed w/o problems.  Denies medication side effects.      Past medical history, Surgical history, Family history not pertinant except as noted below, Social history, Allergies, and medications have been entered into the medical record, reviewed, and corrections made.   Review of Systems: No fevers, chills, night sweats, weight loss, chest pain, or shortness of breath.   Objective:    General: Well Developed, well nourished, and in no acute distress.  Neuro: Alert and oriented x3, extra-ocular muscles intact, sensation grossly intact.  HEENT: Normocephalic, atraumatic  Skin: Warm and dry, no rashes. Cardiac: Regular rate and rhythm, no murmurs rubs or gallops, no lower extremity edema.  Respiratory: Clear to auscultation bilaterally. Not using accessory muscles, speaking in full sentences.   Impression and Recommendations:    Diabetes -overall well controlled.  Hemoglobin A1c of 6.8 today though it is up from previous of 6.6.  Just encouraged him to get back on track with diet and regular exercise in addition to continuing with the metformin.  We also discussed the possibility of adding a second medication such as Januvia etc. to his current regimen to better control his blood sugars if his A1c continues to rise.  HTN - Well controlled. Continue current regimen. Follow up in  21months.

## 2018-02-20 ENCOUNTER — Other Ambulatory Visit: Payer: Self-pay | Admitting: Family Medicine

## 2018-03-07 DIAGNOSIS — H2513 Age-related nuclear cataract, bilateral: Secondary | ICD-10-CM | POA: Diagnosis not present

## 2018-03-07 DIAGNOSIS — E113293 Type 2 diabetes mellitus with mild nonproliferative diabetic retinopathy without macular edema, bilateral: Secondary | ICD-10-CM | POA: Diagnosis not present

## 2018-03-07 LAB — HM DIABETES EYE EXAM

## 2018-04-01 ENCOUNTER — Ambulatory Visit (INDEPENDENT_AMBULATORY_CARE_PROVIDER_SITE_OTHER): Payer: Medicare Other | Admitting: Family Medicine

## 2018-04-01 ENCOUNTER — Encounter: Payer: Self-pay | Admitting: Family Medicine

## 2018-04-01 VITALS — BP 136/60 | HR 64 | Ht 71.25 in | Wt 246.0 lb

## 2018-04-01 DIAGNOSIS — E1169 Type 2 diabetes mellitus with other specified complication: Secondary | ICD-10-CM

## 2018-04-01 DIAGNOSIS — I10 Essential (primary) hypertension: Secondary | ICD-10-CM | POA: Diagnosis not present

## 2018-04-01 DIAGNOSIS — E669 Obesity, unspecified: Secondary | ICD-10-CM

## 2018-04-01 DIAGNOSIS — M1A09X1 Idiopathic chronic gout, multiple sites, with tophus (tophi): Secondary | ICD-10-CM | POA: Diagnosis not present

## 2018-04-01 LAB — POCT GLYCOSYLATED HEMOGLOBIN (HGB A1C): Hemoglobin A1C: 6.9 % — AB (ref 4.0–5.6)

## 2018-04-01 NOTE — Progress Notes (Signed)
Subjective:    CC: DM, HTN  HPI:  Hypertension- Pt denies chest pain, SOB, dizziness, or heart palpitations.  Taking meds as directed w/o problems.  Denies medication side effects.    Diabetes - no hypoglycemic events. No wounds or sores that are not healing well. No increased thirst or urination. Checking glucose at home. Taking medications as prescribed without any side effects. Had eye exam last mo w/ Dr. Harl Bowie.    F/U gout - doingwell overall. Recently started turmeric for his joints. Wears his orthotics for his feet.    Past medical history, Surgical history, Family history not pertinant except as noted below, Social history, Allergies, and medications have been entered into the medical record, reviewed, and corrections made.   Review of Systems: No fevers, chills, night sweats, weight loss, chest pain, or shortness of breath.   Objective:    General: Well Developed, well nourished, and in no acute distress.  Neuro: Alert and oriented x3, extra-ocular muscles intact, sensation grossly intact.  HEENT: Normocephalic, atraumatic  Skin: Warm and dry, no rashes. Cardiac: Regular rate and rhythm, no murmurs rubs or gallops, no lower extremity edema.  Respiratory: Clear to auscultation bilaterally. Not using accessory muscles, speaking in full sentences.   Impression and Recommendations:    HTN - Well controlled. Continue current regimen. Follow up in  24months.    DM - Well controlled, though A1c is borderline.  We discussed the importance of regular exercise to help keep his A1c under 7 otherwise we will need to adjust his medication especially through the winter months it may be a little bit more difficult for him to exercise regularly.  I also would love to see him lose about 5 pounds.  Continue current regimen. Follow up in  4 months.     Gout -doing well overall.  Will check uric acid level.  He is taking some turmeric.  He is wanted to make sure it was safe with his other  medication as far as I am aware it is.

## 2018-04-01 NOTE — Patient Instructions (Signed)

## 2018-05-15 ENCOUNTER — Encounter: Payer: Self-pay | Admitting: Family Medicine

## 2018-05-15 ENCOUNTER — Other Ambulatory Visit: Payer: Self-pay | Admitting: Family Medicine

## 2018-06-03 ENCOUNTER — Other Ambulatory Visit: Payer: Self-pay | Admitting: Family Medicine

## 2018-07-02 ENCOUNTER — Ambulatory Visit (INDEPENDENT_AMBULATORY_CARE_PROVIDER_SITE_OTHER): Payer: Medicare Other | Admitting: Family Medicine

## 2018-07-02 ENCOUNTER — Encounter: Payer: Self-pay | Admitting: Family Medicine

## 2018-07-02 VITALS — BP 136/69 | HR 57 | Ht 71.0 in | Wt 248.0 lb

## 2018-07-02 DIAGNOSIS — E785 Hyperlipidemia, unspecified: Secondary | ICD-10-CM

## 2018-07-02 DIAGNOSIS — M1A09X1 Idiopathic chronic gout, multiple sites, with tophus (tophi): Secondary | ICD-10-CM

## 2018-07-02 DIAGNOSIS — I10 Essential (primary) hypertension: Secondary | ICD-10-CM

## 2018-07-02 DIAGNOSIS — E669 Obesity, unspecified: Secondary | ICD-10-CM | POA: Diagnosis not present

## 2018-07-02 DIAGNOSIS — E1169 Type 2 diabetes mellitus with other specified complication: Secondary | ICD-10-CM | POA: Diagnosis not present

## 2018-07-02 DIAGNOSIS — Z125 Encounter for screening for malignant neoplasm of prostate: Secondary | ICD-10-CM

## 2018-07-02 LAB — POCT GLYCOSYLATED HEMOGLOBIN (HGB A1C): Hemoglobin A1C: 6.8 % — AB (ref 4.0–5.6)

## 2018-07-02 MED ORDER — METFORMIN HCL 1000 MG PO TABS: ORAL_TABLET | ORAL | 2 refills | Status: DC

## 2018-07-02 NOTE — Progress Notes (Signed)
Subjective:    CC: DM, HTN  HPI:  Diabetes - no hypoglycemic events. No wounds or sores that are not healing well. No increased thirst or urination. Checking glucose at home. Taking medications as prescribed without any side effects.  Hypertension- Pt denies chest pain, SOB, dizziness, or heart palpitations.  Taking meds as directed w/o problems.  Denies medication side effects.    F/U Gout -he on allopurinol.  No recent flares or exacerbations.  Dyslipidemia-Oertli on simvastatin.  Tolerating well without any side effects or problems or myalgias.  Past medical history, Surgical history, Family history not pertinant except as noted below, Social history, Allergies, and medications have been entered into the medical record, reviewed, and corrections made.   Review of Systems: No fevers, chills, night sweats, weight loss, chest pain, or shortness of breath.   Objective:    General: Well Developed, well nourished, and in no acute distress.  Neuro: Alert and oriented x3, extra-ocular muscles intact, sensation grossly intact.  HEENT: Normocephalic, atraumatic  Skin: Warm and dry, no rashes. Cardiac: Regular rate and rhythm, no murmurs rubs or gallops, no lower extremity edema.  Respiratory: Clear to auscultation bilaterally. Not using accessory muscles, speaking in full sentences.   Impression and Recommendations:    HTN - Well controlled. Continue current regimen. Follow up in  3-4 months.    DM - Foot exam performed today.  a1C ok.  Home glucose running 150-150. Will increase metformin to 1/2 extra tab at noon.  F/U in 3months.    Gout-due to recheck uric acid level to make sure at therapeutic level.  Hyperlipidemia-due to recheck lipid and liver enzymes.  Continue with statin daily.

## 2018-07-03 ENCOUNTER — Other Ambulatory Visit: Payer: Self-pay | Admitting: Family Medicine

## 2018-07-03 LAB — LIPID PANEL
Cholesterol: 155 mg/dL (ref <200)
HDL: 45 mg/dL (ref >=40)
LDL Cholesterol (Calc): 86 mg/dL (calc)
Non-HDL Cholesterol (Calc): 110 mg/dL (calc) (ref <130)
TRIGLYCERIDES: 143 mg/dL (ref <150)
Total CHOL/HDL Ratio: 3.4 (calc) (ref <5.0)

## 2018-07-03 LAB — COMPLETE METABOLIC PANEL WITH GFR
AG Ratio: 2.2 (calc) (ref 1.0–2.5)
ALT: 36 U/L (ref 9–46)
AST: 28 U/L (ref 10–35)
Albumin: 4.3 g/dL (ref 3.6–5.1)
Alkaline phosphatase (APISO): 52 U/L (ref 35–144)
BUN: 14 mg/dL (ref 7–25)
CO2: 30 mmol/L (ref 20–32)
Calcium: 9.6 mg/dL (ref 8.6–10.3)
Chloride: 101 mmol/L (ref 98–110)
Creat: 0.85 mg/dL (ref 0.70–1.18)
GFR, Est African American: 99 mL/min/{1.73_m2} (ref >=60)
GFR, Est Non African American: 86 mL/min/{1.73_m2} (ref >=60)
Globulin: 2 g/dL (calc) (ref 1.9–3.7)
Glucose, Bld: 136 mg/dL — ABNORMAL HIGH (ref 65–99)
Potassium: 4.6 mmol/L (ref 3.5–5.3)
Sodium: 139 mmol/L (ref 135–146)
TOTAL PROTEIN: 6.3 g/dL (ref 6.1–8.1)
Total Bilirubin: 0.4 mg/dL (ref 0.2–1.2)

## 2018-07-03 LAB — URIC ACID: Uric Acid, Serum: 7.2 mg/dL (ref 4.0–8.0)

## 2018-07-03 LAB — PSA: PSA: 3.5 ng/mL (ref <=4.0)

## 2018-07-03 MED ORDER — ALLOPURINOL 300 MG PO TABS: 150.0000 mg | ORAL_TABLET | Freq: Every day | ORAL | 3 refills | Status: DC

## 2018-10-02 ENCOUNTER — Other Ambulatory Visit: Payer: Self-pay | Admitting: Family Medicine

## 2018-10-11 ENCOUNTER — Other Ambulatory Visit: Payer: Self-pay | Admitting: Family Medicine

## 2018-10-23 ENCOUNTER — Telehealth: Payer: Self-pay | Admitting: *Deleted

## 2018-10-23 NOTE — Telephone Encounter (Signed)
Pt had called earlier and asked that I return his call.  l called him back and lvm asking that he either call back or send a my chart in regards to what he may need.[name], [name], CMA

## 2018-10-24 NOTE — Telephone Encounter (Signed)
Returned pt's call. Rescheduled his appt.[name], [name], CMA

## 2018-10-24 NOTE — Telephone Encounter (Signed)
Brendon called back asking for [employer]. I advised she doesn't have a voicemail. He stated he wanted her to call him back. He gave me his name and the day he was born along with the month. However he didn't give me the year. I advised him I would give [name] the message.

## 2018-11-04 ENCOUNTER — Telehealth: Payer: Medicare Other | Admitting: Family Medicine

## 2018-11-06 ENCOUNTER — Ambulatory Visit (INDEPENDENT_AMBULATORY_CARE_PROVIDER_SITE_OTHER): Payer: Medicare Other | Admitting: Family Medicine

## 2018-11-06 ENCOUNTER — Encounter: Payer: Self-pay | Admitting: Family Medicine

## 2018-11-06 VITALS — BP 136/58 | HR 63 | Ht 71.0 in | Wt 252.0 lb

## 2018-11-06 DIAGNOSIS — E1169 Type 2 diabetes mellitus with other specified complication: Secondary | ICD-10-CM

## 2018-11-06 DIAGNOSIS — E669 Obesity, unspecified: Secondary | ICD-10-CM

## 2018-11-06 DIAGNOSIS — I10 Essential (primary) hypertension: Secondary | ICD-10-CM | POA: Diagnosis not present

## 2018-11-06 DIAGNOSIS — R011 Cardiac murmur, unspecified: Secondary | ICD-10-CM | POA: Insufficient documentation

## 2018-11-06 DIAGNOSIS — M542 Cervicalgia: Secondary | ICD-10-CM | POA: Diagnosis not present

## 2018-11-06 DIAGNOSIS — E785 Hyperlipidemia, unspecified: Secondary | ICD-10-CM

## 2018-11-06 LAB — POCT UA - MICROALBUMIN
Albumin/Creatinine Ratio, Urine, POC: <30
Creatinine, POC: 200 mg/dL
Microalbumin Ur, POC: 30 mg/L

## 2018-11-06 LAB — POCT GLYCOSYLATED HEMOGLOBIN (HGB A1C): Hemoglobin A1C: 6.6 % — AB (ref 4.0–5.6)

## 2018-11-06 NOTE — Assessment & Plan Note (Signed)
Encouraged him to increase exercise and work on losing about 10 lbs.

## 2018-11-06 NOTE — Assessment & Plan Note (Signed)
Well controlled. Continue current regimen. Follow up in  6 months.  

## 2018-11-06 NOTE — Assessment & Plan Note (Signed)
Well controlled. Continue current regimen. Follow up in  3-4 months.  

## 2018-11-06 NOTE — Assessment & Plan Note (Signed)
Continue statin. 

## 2018-11-06 NOTE — Progress Notes (Signed)
Established Patient Office Visit  Subjective:  Patient ID: Aaron Horn, male    DOB: 1945-06-29  Age: 73 y.o. MRN: [id number]  CC:  Chief Complaint  Patient presents with  . Hyperlipidemia  . Diabetes    HPI [facility] presents for   Diabetes - no hypoglycemic events. No wounds or sores that are not healing well. No increased thirst or urination. Checking glucose at home. Taking medications as prescribed without any side effects.  Hyperlipidemia -tolerating statin well without any side effects or problems.  He also complains of left-sided neck pain.  He says is been going on for a couple of months.  It bothers him the most when he first wakes up in the morning and tries to sit up he says it will feel sore and tight at times.  He is not really tried any specific treatments and wonders if maybe seeing a chiropractor would be helpful.  CV-admits that he is trying to stay active but has not been exercising regularly since he has not been able to go to the gym.   Past Medical History:  Diagnosis Date  . AV block, 1st degree   . Dyslipidemia   . FH: colonic polyps   . Gout   . Herpes zoster   . HTN (hypertension)    benign essential  . Impaired fasting glucose     Past Surgical History:  Procedure Laterality Date  . COLONOSCOPY  2002   polyps   . tohphi removal foot  2000    Family History  Problem Relation Age of Onset  . Hypertension Mother   . Esophageal cancer Father     Social History   Socioeconomic History  . Marital status: Married    Spouse name: Not on file  . Number of children: Not on file  . Years of education: Not on file  . Highest education level: Not on file  Occupational History  . Not on file  Social Needs  . Financial resource strain: Not on file  . Food insecurity    Worry: Not on file    Inability: Not on file  . Transportation needs    Medical: Not on file    Non-medical: Not on file  Tobacco Use  . Smoking status:  Former Research scientist (life sciences)  . Smokeless tobacco: Never Used  . Tobacco comment: quit smoking in 1998 after 20 pack yr hx  Substance and Sexual Activity  . Alcohol use: Yes    Comment: 2 etoh a night   . Drug use: Not on file  . Sexual activity: Not on file  Lifestyle  . Physical activity    Days per week: Not on file    Minutes per session: Not on file  . Stress: Not on file  Relationships  . Social Herbalist on phone: Not on file    Gets together: Not on file    Attends religious service: Not on file    Active member of club or organization: Not on file    Attends meetings of clubs or organizations: Not on file    Relationship status: Not on file  . Intimate partner violence    Fear of current or ex partner: Not on file    Emotionally abused: Not on file    Physically abused: Not on file    Forced sexual activity: Not on file  Other Topics Concern  . Not on file  Social History Narrative   No regular  exercise.     Outpatient Medications Prior to Visit  Medication Sig Dispense Refill  . allopurinol (ZYLOPRIM) 300 MG tablet Take 0.5 tablets (150 mg total) by mouth daily. 45 tablet 3  . AMBULATORY NON FORMULARY MEDICATION Medication Name: Glucometer, testing strips, and lancets.  Test once one day. 100 Units PRN  . AMBULATORY NON FORMULARY MEDICATION Medication Name: lancets  DX code: E11.9 type 2 DM 100 each prn  . aspirin (ASPIR-LOW) 81 MG EC tablet Take 81 mg by mouth daily.      . Cholecalciferol (VITAMIN D3 PO) Take 1 capsule by mouth. Taking once a week    . Colchicine 0.6 MG CAPS 2 on Day 1, then one a day PRN gout flare.  Ok to sub generic or tabs if cheaper. 30 capsule 11  . fish oil-omega-3 fatty acids 1000 MG capsule Take 4 mg by mouth daily.     . hydrochlorothiazide (HYDRODIURIL) 25 MG tablet Take 1 tablet (25 mg total) by mouth daily. Due for follow up 90 tablet 3  . ketoconazole (NIZORAL) 2 % shampoo APPLY 1 APPLICATION TOPICALLY TWICE WEEKLY. USE DAILY FOR 1  WEEK 120 mL 2  . lisinopril (PRINIVIL,ZESTRIL) 40 MG tablet TAKE ONE TABLET BY MOUTH ONE TIME DAILY  90 tablet 1  . metFORMIN (GLUCOPHAGE) 1000 MG tablet 1 in AM, 1/2 tab at lunch, and 1 tab in PM 225 tablet 2  . simvastatin (ZOCOR) 40 MG tablet TAKE ONE TABLET BY MOUTH AT BEDTIME  90 tablet 3  . TRUE METRIX BLOOD GLUCOSE TEST test strip USE AS DIRECTED TO TEST ONCE DAILY 100 each 10  . TRUEPLUS LANCETS 30G MISC USE AS DIRECTED TO TEST BLOOD SUGAR ONCE DAILY 100 each PRN  . TURMERIC PO Take 1,000 mg by mouth 2 (two) times daily.     No facility-administered medications prior to visit.     No Known Allergies  ROS Review of Systems    Objective:    Physical Exam  Constitutional: He is oriented to person, place, and time. He appears well-developed and well-nourished.  HENT:  Head: Normocephalic and atraumatic.  Cardiovascular: Normal rate and regular rhythm.  Murmur heard. 2/6 SEM right sternal border  Pulmonary/Chest: Effort normal and breath sounds normal.  Musculoskeletal:     Comments: Neck with normal flexion extension.  Decreased rotation.  He has a palpable firmness over the left side of the neck just lateral to the spine.  I had difficulty telling if it was a knot in the muscle or if it is possibly a swollen lymph node.  Neurological: He is alert and oriented to person, place, and time.  Skin: Skin is warm and dry.  Psychiatric: He has a normal mood and affect. His behavior is normal.    BP (!) 136/58   Pulse 63   Ht 5\' 11"  (1.803 m)   Wt 252 lb (114.3 kg)   SpO2 97%   BMI 35.15 kg/m  Wt Readings from Last 3 Encounters:  11/06/18 252 lb (114.3 kg)  07/02/18 248 lb (112.5 kg)  04/01/18 246 lb (111.6 kg)     There are no preventive care reminders to display for this patient.  There are no preventive care reminders to display for this patient.  No results found for: TSH No results found for: WBC, HGB, HCT, MCV, PLT Lab Results  Component Value Date   NA 139  07/02/2018   K 4.6 07/02/2018   CO2 30 07/02/2018   GLUCOSE 136 (H)  07/02/2018   BUN 14 07/02/2018   CREATININE 0.85 07/02/2018   BILITOT 0.4 07/02/2018   ALKPHOS 50 06/21/2016   AST 28 07/02/2018   ALT 36 07/02/2018   PROT 6.3 07/02/2018   ALBUMIN 4.6 06/21/2016   CALCIUM 9.6 07/02/2018   Lab Results  Component Value Date   CHOL 155 07/02/2018   Lab Results  Component Value Date   HDL 45 07/02/2018   Lab Results  Component Value Date   LDLCALC 86 07/02/2018   Lab Results  Component Value Date   TRIG 143 07/02/2018   Lab Results  Component Value Date   CHOLHDL 3.4 07/02/2018   Lab Results  Component Value Date   HGBA1C 6.6 (A) 11/06/2018      Assessment & Plan:   Problem List Items Addressed This Visit      Cardiovascular and Mediastinum   ESSENTIAL HYPERTENSION, BENIGN - Primary    Well controlled. Continue current regimen. Follow up in  6 months.          Endocrine   Type 2 diabetes mellitus with hyperlipidemia (HCC)    Continue statin.        Relevant Orders   POCT glycosylated hemoglobin (Hb A1C) (Completed)   Diabetes mellitus type 2 in obese (HCC)    Well controlled. Continue current regimen. Follow up in  3-4 months.       Relevant Orders   POCT UA - Microalbumin (Completed)     Other   Severe obesity (BMI 35.0-39.9) with comorbidity ([place])    Encouraged him to increase exercise and work on losing about 10 lbs.        Heart murmur    Other Visit Diagnoses    Neck pain on left side         Left sided neck pain -on exam unclear if it is just a knot in the muscle or if possibly a swollen lymph node.  Recommend conservative treatment over the next couple weeks with gentle stretches and heat.  He can even consider chiropractic care.  If not improving then please let me know and we will schedule for further work-up for possible enlarged lymph node.  No orders of the defined types were placed in this encounter.   Follow-up: Return in  about 3 months (around 02/06/2019).    Beatrice Lecher, MD

## 2018-11-13 ENCOUNTER — Other Ambulatory Visit: Payer: Self-pay | Admitting: Family Medicine

## 2018-12-16 DIAGNOSIS — H2513 Age-related nuclear cataract, bilateral: Secondary | ICD-10-CM | POA: Diagnosis not present

## 2018-12-16 DIAGNOSIS — H25043 Posterior subcapsular polar age-related cataract, bilateral: Secondary | ICD-10-CM | POA: Diagnosis not present

## 2018-12-20 ENCOUNTER — Other Ambulatory Visit: Payer: Self-pay | Admitting: Family Medicine

## 2019-01-05 ENCOUNTER — Telehealth: Payer: Self-pay

## 2019-01-05 NOTE — Telephone Encounter (Signed)
Returned pt's call. lvm asking that he call back.[name], [name], CMA

## 2019-01-05 NOTE — Telephone Encounter (Signed)
Aaron Horn would like [name] to call him back.

## 2019-01-06 NOTE — Telephone Encounter (Signed)
Patient returned [name]'s call and left msg for her to call back.. (:

## 2019-01-06 NOTE — Telephone Encounter (Signed)
Returned pt call. He reports that he is having cataract surgery and has a form that will need to be filled out prior to his surgery. He will drop off to be completed and come back to p/u.Marland KitchenElouise Munroe, [place]

## 2019-01-13 HISTORY — PX: CATARACT EXTRACTION: SUR2

## 2019-01-13 NOTE — Telephone Encounter (Signed)
Patient called- states he needs [name] to call him.Aaron Horn

## 2019-01-15 NOTE — Telephone Encounter (Signed)
lvm advising pt that his form is complete and he can stop by to p/u anytime.[name], [name], CMA

## 2019-01-23 DIAGNOSIS — H2513 Age-related nuclear cataract, bilateral: Secondary | ICD-10-CM | POA: Diagnosis not present

## 2019-01-23 DIAGNOSIS — H2512 Age-related nuclear cataract, left eye: Secondary | ICD-10-CM | POA: Diagnosis not present

## 2019-01-31 DIAGNOSIS — H25811 Combined forms of age-related cataract, right eye: Secondary | ICD-10-CM | POA: Insufficient documentation

## 2019-02-01 DIAGNOSIS — Z01818 Encounter for other preprocedural examination: Secondary | ICD-10-CM | POA: Diagnosis not present

## 2019-02-05 DIAGNOSIS — H2589 Other age-related cataract: Secondary | ICD-10-CM | POA: Diagnosis not present

## 2019-02-05 DIAGNOSIS — H25041 Posterior subcapsular polar age-related cataract, right eye: Secondary | ICD-10-CM | POA: Diagnosis not present

## 2019-02-05 DIAGNOSIS — Z7982 Long term (current) use of aspirin: Secondary | ICD-10-CM | POA: Diagnosis not present

## 2019-02-05 DIAGNOSIS — Z79899 Other long term (current) drug therapy: Secondary | ICD-10-CM | POA: Diagnosis not present

## 2019-02-05 DIAGNOSIS — I10 Essential (primary) hypertension: Secondary | ICD-10-CM | POA: Diagnosis not present

## 2019-02-05 DIAGNOSIS — Z7984 Long term (current) use of oral hypoglycemic drugs: Secondary | ICD-10-CM | POA: Diagnosis not present

## 2019-02-05 DIAGNOSIS — Z87891 Personal history of nicotine dependence: Secondary | ICD-10-CM | POA: Diagnosis not present

## 2019-02-05 DIAGNOSIS — G4733 Obstructive sleep apnea (adult) (pediatric): Secondary | ICD-10-CM | POA: Diagnosis not present

## 2019-02-05 DIAGNOSIS — E785 Hyperlipidemia, unspecified: Secondary | ICD-10-CM | POA: Diagnosis not present

## 2019-02-05 DIAGNOSIS — H2512 Age-related nuclear cataract, left eye: Secondary | ICD-10-CM | POA: Diagnosis not present

## 2019-02-05 DIAGNOSIS — E1136 Type 2 diabetes mellitus with diabetic cataract: Secondary | ICD-10-CM | POA: Diagnosis not present

## 2019-02-06 DIAGNOSIS — Z961 Presence of intraocular lens: Secondary | ICD-10-CM | POA: Diagnosis not present

## 2019-02-10 ENCOUNTER — Other Ambulatory Visit: Payer: Self-pay

## 2019-02-10 ENCOUNTER — Other Ambulatory Visit: Payer: Self-pay | Admitting: Family Medicine

## 2019-02-10 ENCOUNTER — Encounter: Payer: Self-pay | Admitting: Family Medicine

## 2019-02-10 ENCOUNTER — Ambulatory Visit (INDEPENDENT_AMBULATORY_CARE_PROVIDER_SITE_OTHER): Payer: Medicare Other | Admitting: Family Medicine

## 2019-02-10 VITALS — BP 131/71 | HR 61 | Ht 71.0 in | Wt 253.0 lb

## 2019-02-10 DIAGNOSIS — Z23 Encounter for immunization: Secondary | ICD-10-CM

## 2019-02-10 DIAGNOSIS — E785 Hyperlipidemia, unspecified: Secondary | ICD-10-CM | POA: Diagnosis not present

## 2019-02-10 DIAGNOSIS — M1A09X1 Idiopathic chronic gout, multiple sites, with tophus (tophi): Secondary | ICD-10-CM

## 2019-02-10 DIAGNOSIS — I10 Essential (primary) hypertension: Secondary | ICD-10-CM | POA: Diagnosis not present

## 2019-02-10 DIAGNOSIS — E1169 Type 2 diabetes mellitus with other specified complication: Secondary | ICD-10-CM | POA: Diagnosis not present

## 2019-02-10 DIAGNOSIS — E669 Obesity, unspecified: Secondary | ICD-10-CM

## 2019-02-10 LAB — POCT GLYCOSYLATED HEMOGLOBIN (HGB A1C): Hemoglobin A1C: 6.8 % — AB (ref 4.0–5.6)

## 2019-02-10 NOTE — Assessment & Plan Note (Signed)
A1c is up a little bit from previous it is now 6.8.  Just encouraged him to make sure that he is staying active.  He is been a little bit more sedentary recently because of his eye surgery.  And really like him to work on losing about 10 pounds as well.

## 2019-02-10 NOTE — Progress Notes (Signed)
Established Patient Office Visit  Subjective:  Patient ID: Aaron Horn, male    DOB: 1946-03-15  Age: 73 y.o. MRN: [id number]  CC:  Chief Complaint  Patient presents with  . Diabetes  . Hypertension    HPI Aaron Horn presents for   Diabetes - no hypoglycemic events. No wounds or sores that are not healing well. No increased thirst or urination. Checking glucose at home. Taking medications as prescribed without any side effects.  He has not been as active recently.  He just had cataract surgery.  He knows he needs to work on losing some weight.  Says oddly when he does eat sweets or carbs he feels like his blood sugar is high about 2 days later.  Hypertension- Pt denies chest pain, SOB, dizziness, or heart palpitations.  Taking meds as directed w/o problems.  Denies medication side effects.    Gout follow-up-he is actually doing really well he has not had any recent flares or exacerbations.  He is currently on allopurinol 150 mg daily.  Past Medical History:  Diagnosis Date  . AV block, 1st degree   . Dyslipidemia   . FH: colonic polyps   . Gout   . Herpes zoster   . HTN (hypertension)    benign essential  . Impaired fasting glucose     Past Surgical History:  Procedure Laterality Date  . COLONOSCOPY  2002   polyps   . tohphi removal foot  2000    Family History  Problem Relation Age of Onset  . Hypertension Mother   . Esophageal cancer Father     Social History   Socioeconomic History  . Marital status: Married    Spouse name: Not on file  . Number of children: Not on file  . Years of education: Not on file  . Highest education level: Not on file  Occupational History  . Not on file  Social Needs  . Financial resource strain: Not on file  . Food insecurity    Worry: Not on file    Inability: Not on file  . Transportation needs    Medical: Not on file    Non-medical: Not on file  Tobacco Use  . Smoking status: Former Research scientist (life sciences)  . Smokeless  tobacco: Never Used  . Tobacco comment: quit smoking in 1998 after 20 pack yr hx  Substance and Sexual Activity  . Alcohol use: Yes    Comment: 2 etoh a night   . Drug use: Not on file  . Sexual activity: Not on file  Lifestyle  . Physical activity    Days per week: Not on file    Minutes per session: Not on file  . Stress: Not on file  Relationships  . Social Herbalist on phone: Not on file    Gets together: Not on file    Attends religious service: Not on file    Active member of club or organization: Not on file    Attends meetings of clubs or organizations: Not on file    Relationship status: Not on file  . Intimate partner violence    Fear of current or ex partner: Not on file    Emotionally abused: Not on file    Physically abused: Not on file    Forced sexual activity: Not on file  Other Topics Concern  . Not on file  Social History Narrative   No regular exercise.     Outpatient Medications Prior  to Visit  Medication Sig Dispense Refill  . allopurinol (ZYLOPRIM) 300 MG tablet Take 0.5 tablets (150 mg total) by mouth daily. 45 tablet 3  . AMBULATORY NON FORMULARY MEDICATION Medication Name: Glucometer, testing strips, and lancets.  Test once one day. 100 Units PRN  . AMBULATORY NON FORMULARY MEDICATION Medication Name: lancets  DX code: E11.9 type 2 DM 100 each prn  . aspirin (ASPIR-LOW) 81 MG EC tablet Take 81 mg by mouth daily.      . Cholecalciferol (VITAMIN D3 PO) Take 1 capsule by mouth. Taking once a week    . Colchicine 0.6 MG CAPS 2 on Day 1, then one a day PRN gout flare.  Ok to sub generic or tabs if cheaper. 30 capsule 11  . fish oil-omega-3 fatty acids 1000 MG capsule Take 4 mg by mouth daily.     . hydrochlorothiazide (HYDRODIURIL) 25 MG tablet Take 1 tablet (25 mg total) by mouth daily. 90 tablet 1  . ketoconazole (NIZORAL) 2 % shampoo APPLY 1 APPLICATION TOPICALLY TWICE WEEKLY. USE DAILY FOR 1 WEEK 120 mL 2  . metFORMIN (GLUCOPHAGE) 1000 MG  tablet 1 in AM, 1/2 tab at lunch, and 1 tab in PM 225 tablet 2  . simvastatin (ZOCOR) 40 MG tablet TAKE ONE TABLET BY MOUTH AT BEDTIME  90 tablet 1  . TRUE METRIX BLOOD GLUCOSE TEST test strip USE AS DIRECTED TO TEST ONCE DAILY 100 each 10  . TRUEPLUS LANCETS 30G MISC USE AS DIRECTED TO TEST BLOOD SUGAR ONCE DAILY 100 each PRN  . TURMERIC PO Take 1,000 mg by mouth 2 (two) times daily.    Marland Kitchen lisinopril (PRINIVIL,ZESTRIL) 40 MG tablet TAKE ONE TABLET BY MOUTH ONE TIME DAILY  90 tablet 1   No facility-administered medications prior to visit.     No Known Allergies  ROS Review of Systems    Objective:    Physical Exam  Constitutional: He is oriented to person, place, and time. He appears well-developed and well-nourished.  HENT:  Head: Normocephalic and atraumatic.  Cardiovascular: Normal rate, regular rhythm and normal heart sounds.  Pulmonary/Chest: Effort normal and breath sounds normal.  Neurological: He is alert and oriented to person, place, and time.  Skin: Skin is warm and dry.  Psychiatric: He has a normal mood and affect. His behavior is normal.    BP 131/71   Pulse 61   Ht 5\' 11"  (1.803 m)   Wt 253 lb (114.8 kg)   SpO2 97%   BMI 35.29 kg/m  Wt Readings from Last 3 Encounters:  02/10/19 253 lb (114.8 kg)  11/06/18 252 lb (114.3 kg)  07/02/18 248 lb (112.5 kg)     Health Maintenance Due  Topic Date Due  . INFLUENZA VACCINE  12/13/2018    There are no preventive care reminders to display for this patient.  No results found for: TSH No results found for: WBC, HGB, HCT, MCV, PLT Lab Results  Component Value Date   NA 139 07/02/2018   K 4.6 07/02/2018   CO2 30 07/02/2018   GLUCOSE 136 (H) 07/02/2018   BUN 14 07/02/2018   CREATININE 0.85 07/02/2018   BILITOT 0.4 07/02/2018   ALKPHOS 50 06/21/2016   AST 28 07/02/2018   ALT 36 07/02/2018   PROT 6.3 07/02/2018   ALBUMIN 4.6 06/21/2016   CALCIUM 9.6 07/02/2018   Lab Results  Component Value Date   CHOL  155 07/02/2018   Lab Results  Component Value Date  HDL 45 07/02/2018   Lab Results  Component Value Date   LDLCALC 86 07/02/2018   Lab Results  Component Value Date   TRIG 143 07/02/2018   Lab Results  Component Value Date   CHOLHDL 3.4 07/02/2018   Lab Results  Component Value Date   HGBA1C 6.8 (A) 02/10/2019      Assessment & Plan:   Problem List Items Addressed This Visit      Cardiovascular and Mediastinum   ESSENTIAL HYPERTENSION, BENIGN    Well controlled. Continue current regimen. Follow up in  4 mo        Endocrine   Type 2 diabetes mellitus with hyperlipidemia (HCC)    A1c is up a little bit from previous it is now 6.8.  Just encouraged him to make sure that he is staying active.  He is been a little bit more sedentary recently because of his eye surgery.  And really like him to work on losing about 10 pounds as well.      Relevant Orders   POCT glycosylated hemoglobin (Hb A1C) (Completed)   Diabetes mellitus type 2 in obese (HCC)     Other   Gout - Primary    No recent flares or exacerbations.  Well-controlled on current regimen.       Other Visit Diagnoses    Need for immunization against influenza       Relevant Orders   Flu Vaccine QUAD High Dose(Fluad) (Completed)      No orders of the defined types were placed in this encounter.   Follow-up: Return in about 3 months (around 05/12/2019) for Diabetes follow-up.    Beatrice Lecher, MD

## 2019-02-10 NOTE — Assessment & Plan Note (Signed)
No recent flares or exacerbations.  Well-controlled on current regimen.

## 2019-02-10 NOTE — Assessment & Plan Note (Signed)
Well controlled. Continue current regimen. Follow up in  4 mo 

## 2019-04-08 DIAGNOSIS — H2511 Age-related nuclear cataract, right eye: Secondary | ICD-10-CM | POA: Diagnosis not present

## 2019-04-13 ENCOUNTER — Other Ambulatory Visit: Payer: Self-pay | Admitting: Family Medicine

## 2019-04-13 DIAGNOSIS — Z01818 Encounter for other preprocedural examination: Secondary | ICD-10-CM | POA: Diagnosis not present

## 2019-04-14 HISTORY — PX: CATARACT EXTRACTION: SUR2

## 2019-04-16 DIAGNOSIS — Z79899 Other long term (current) drug therapy: Secondary | ICD-10-CM | POA: Diagnosis not present

## 2019-04-16 DIAGNOSIS — Z87891 Personal history of nicotine dependence: Secondary | ICD-10-CM | POA: Diagnosis not present

## 2019-04-16 DIAGNOSIS — E1136 Type 2 diabetes mellitus with diabetic cataract: Secondary | ICD-10-CM | POA: Diagnosis not present

## 2019-04-16 DIAGNOSIS — H2511 Age-related nuclear cataract, right eye: Secondary | ICD-10-CM | POA: Diagnosis not present

## 2019-04-16 DIAGNOSIS — G4730 Sleep apnea, unspecified: Secondary | ICD-10-CM | POA: Diagnosis not present

## 2019-04-16 DIAGNOSIS — Z7982 Long term (current) use of aspirin: Secondary | ICD-10-CM | POA: Diagnosis not present

## 2019-04-16 DIAGNOSIS — E785 Hyperlipidemia, unspecified: Secondary | ICD-10-CM | POA: Diagnosis not present

## 2019-04-16 DIAGNOSIS — I10 Essential (primary) hypertension: Secondary | ICD-10-CM | POA: Diagnosis not present

## 2019-04-21 IMAGING — DX DG FOOT COMPLETE 3+V*R*
3 series · 3 of 3 positions shown · non-contrast
Comparison: None.

CLINICAL DATA: Fifth metatarsal pain for 4 weeks

EXAM:
RIGHT FOOT COMPLETE - 3+ VIEW

[foot ap]
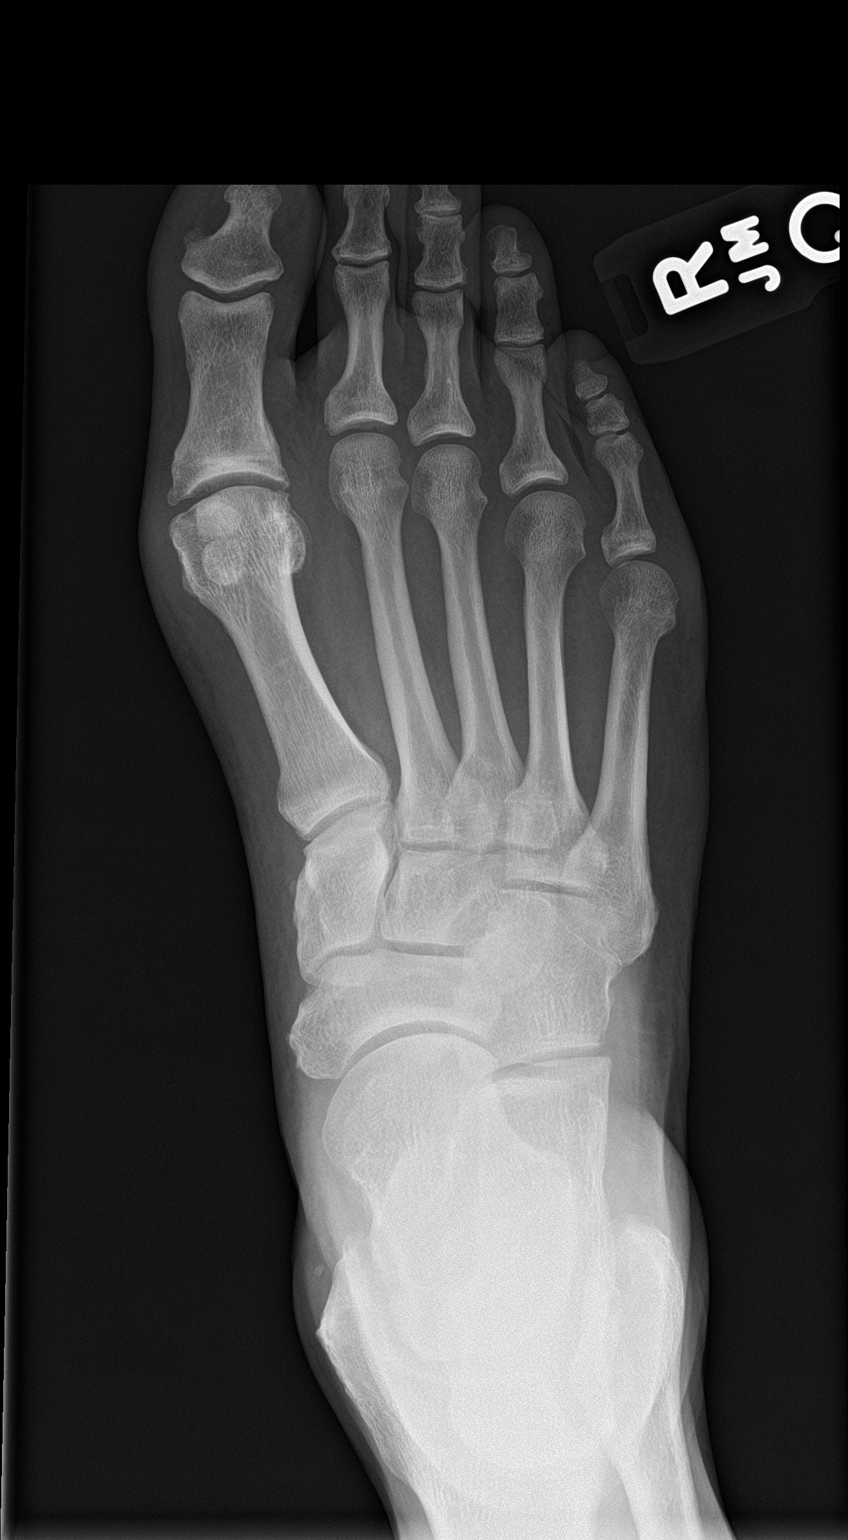

[foot obl]
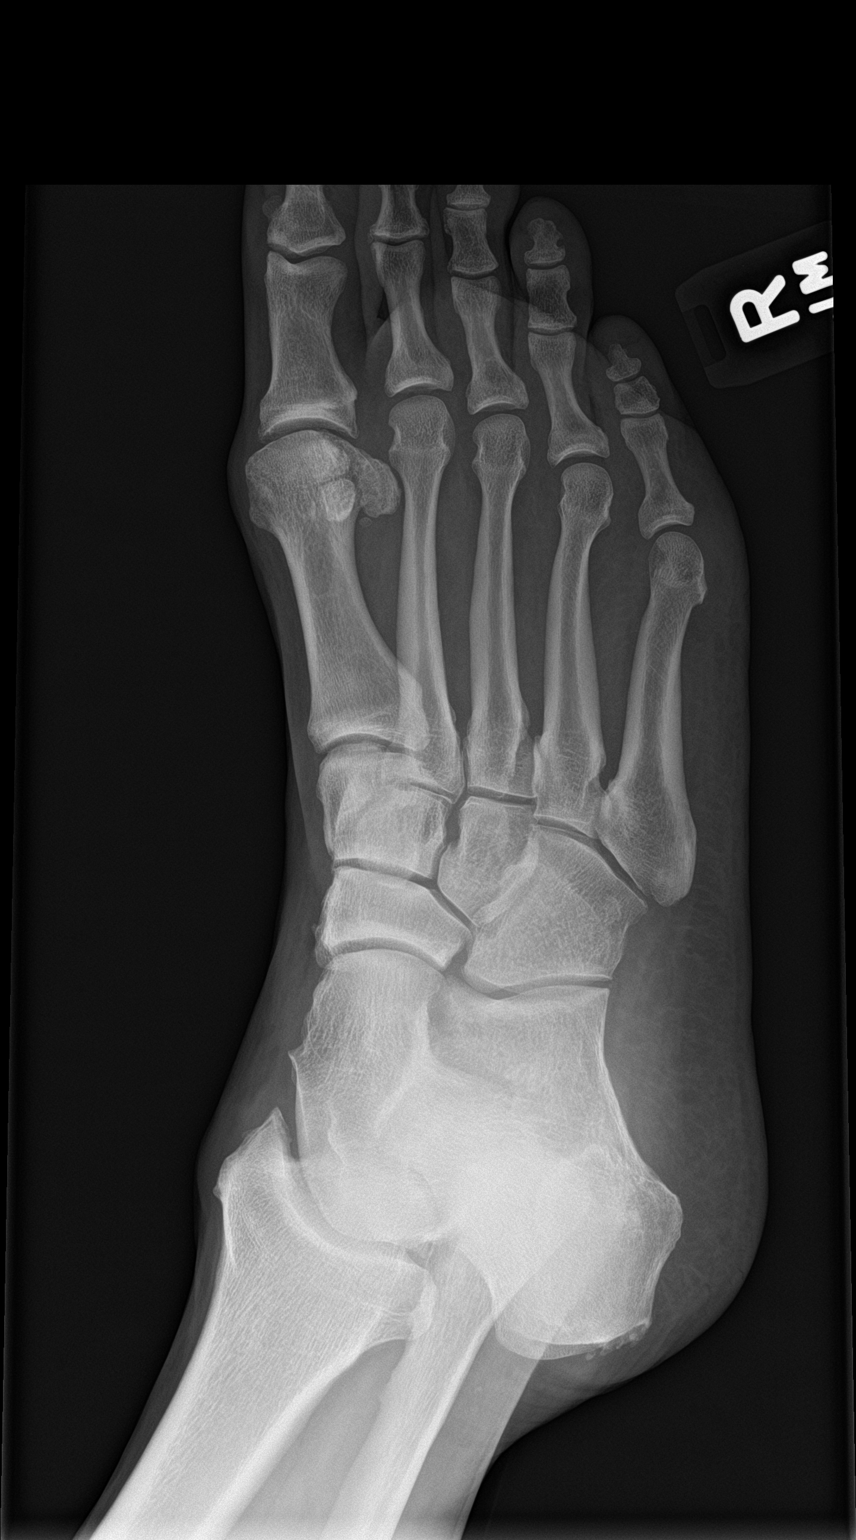

[foot lat]
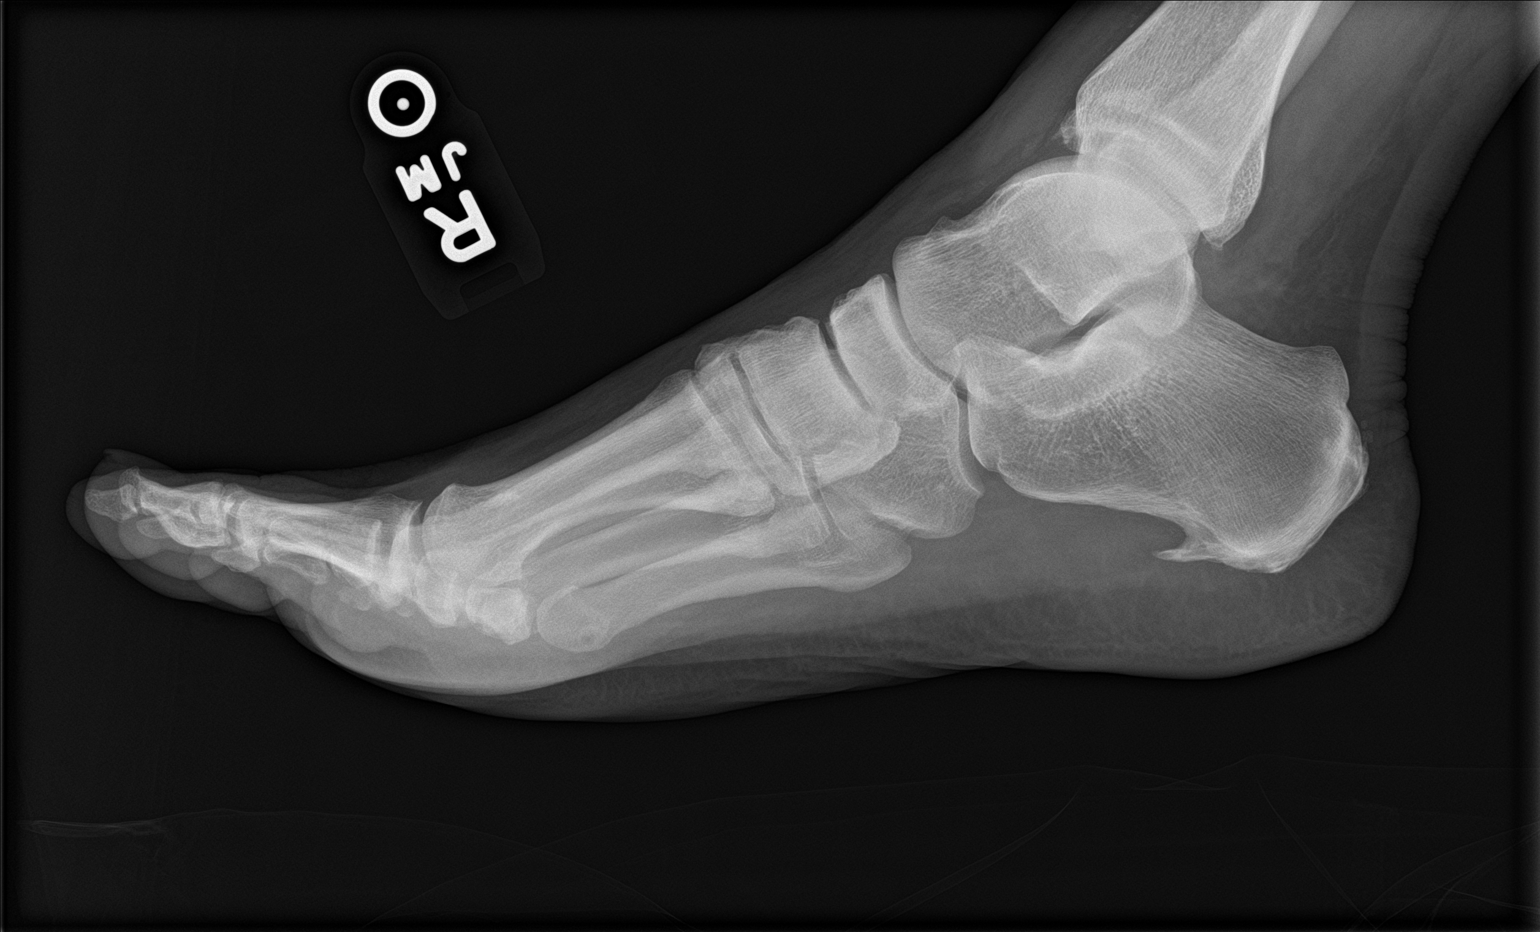

[3 of 3 positions shown; findings below may reference images not displayed]

FINDINGS: There is no evidence of fracture or dislocation. Moderate plantar
calcaneal spur. Soft tissues are unremarkable. Mild arthritis at the
first MTP joint.
IMPRESSION: No acute osseous abnormality.  Moderate plantar calcaneal spur.

## 2019-05-06 ENCOUNTER — Other Ambulatory Visit: Payer: Self-pay | Admitting: Family Medicine

## 2019-05-12 ENCOUNTER — Other Ambulatory Visit: Payer: Self-pay

## 2019-05-12 ENCOUNTER — Encounter: Payer: Self-pay | Admitting: Family Medicine

## 2019-05-12 ENCOUNTER — Ambulatory Visit (INDEPENDENT_AMBULATORY_CARE_PROVIDER_SITE_OTHER): Payer: Medicare Other | Admitting: Family Medicine

## 2019-05-12 VITALS — BP 132/72 | HR 60 | Ht 71.0 in | Wt 253.0 lb

## 2019-05-12 DIAGNOSIS — E785 Hyperlipidemia, unspecified: Secondary | ICD-10-CM

## 2019-05-12 DIAGNOSIS — M1A09X1 Idiopathic chronic gout, multiple sites, with tophus (tophi): Secondary | ICD-10-CM | POA: Diagnosis not present

## 2019-05-12 DIAGNOSIS — Z125 Encounter for screening for malignant neoplasm of prostate: Secondary | ICD-10-CM

## 2019-05-12 DIAGNOSIS — I10 Essential (primary) hypertension: Secondary | ICD-10-CM

## 2019-05-12 DIAGNOSIS — E1169 Type 2 diabetes mellitus with other specified complication: Secondary | ICD-10-CM

## 2019-05-12 LAB — POCT GLYCOSYLATED HEMOGLOBIN (HGB A1C): Hemoglobin A1C: 7.9 % — AB (ref 4.0–5.6)

## 2019-05-12 MED ORDER — CANAGLIFLOZIN 100 MG PO TABS: 100.0000 mg | ORAL_TABLET | Freq: Every day | ORAL | 3 refills | Status: DC

## 2019-05-12 NOTE — Assessment & Plan Note (Signed)
Well controlled. Continue current regimen. Follow up in  4 mo 

## 2019-05-12 NOTE — Assessment & Plan Note (Signed)
Really well on current regimen.

## 2019-05-12 NOTE — Assessment & Plan Note (Signed)
Uncontrolled today.  A1c is up to 7.9.  He really does not like he had any major dietary changes but exercise has dropped off so we discussed getting back into some type of routine with that.  It can make a really big difference in those numbers.  We also discussed adding a second medication to his Metformin.

## 2019-05-12 NOTE — Progress Notes (Signed)
Established Patient Office Visit  Subjective:  Patient ID: Aaron Horn, male    DOB: 04/28/1946  Age: 73 y.o. MRN: [id number]  CC:  Chief Complaint  Patient presents with  . Diabetes  . Hypertension    HPI [facility] presents for   Hypertension- Pt denies chest pain, SOB, dizziness, or heart palpitations.  Taking meds as directed w/o problems.  Denies medication side effects.    Diabetes - no hypoglycemic events. No wounds or sores that are not healing well. No increased thirst or urination. Checking glucose at home. Taking medications as prescribed without any side effects.  Weight is stable.  But admits he has not been exercising like he used to.  He does not feel like there have been any major changes with his diet.  F/U Gout -he is doing well on his allopurinol.  He has not had any recent flares or exacerbations with his gout.  Past Medical History:  Diagnosis Date  . AV block, 1st degree   . Dyslipidemia   . FH: colonic polyps   . Gout   . Herpes zoster   . HTN (hypertension)    benign essential  . Impaired fasting glucose     Past Surgical History:  Procedure Laterality Date  . COLONOSCOPY  2002   polyps   . tohphi removal foot  2000    Family History  Problem Relation Age of Onset  . Hypertension Mother   . Esophageal cancer Father     Social History   Socioeconomic History  . Marital status: Married    Spouse name: Not on file  . Number of children: Not on file  . Years of education: Not on file  . Highest education level: Not on file  Occupational History  . Not on file  Tobacco Use  . Smoking status: Former Research scientist (life sciences)  . Smokeless tobacco: Never Used  . Tobacco comment: quit smoking in 1998 after 20 pack yr hx  Substance and Sexual Activity  . Alcohol use: Yes    Comment: 2 etoh a night   . Drug use: Not on file  . Sexual activity: Not on file  Other Topics Concern  . Not on file  Social History Narrative   No regular exercise.     Social Determinants of Health   Financial Resource Strain:   . Difficulty of Paying Living Expenses: Not on file  Food Insecurity:   . Worried About Charity fundraiser in the Last Year: Not on file  . Ran Out of Food in the Last Year: Not on file  Transportation Needs:   . Lack of Transportation (Medical): Not on file  . Lack of Transportation (Non-Medical): Not on file  Physical Activity:   . Days of Exercise per Week: Not on file  . Minutes of Exercise per Session: Not on file  Stress:   . Feeling of Stress : Not on file  Social Connections:   . Frequency of Communication with Friends and Family: Not on file  . Frequency of Social Gatherings with Friends and Family: Not on file  . Attends Religious Services: Not on file  . Active Member of Clubs or Organizations: Not on file  . Attends Archivist Meetings: Not on file  . Marital Status: Not on file  Intimate Partner Violence:   . Fear of Current or Ex-Partner: Not on file  . Emotionally Abused: Not on file  . Physically Abused: Not on file  .  Sexually Abused: Not on file    Outpatient Medications Prior to Visit  Medication Sig Dispense Refill  . allopurinol (ZYLOPRIM) 300 MG tablet Take 0.5 tablets (150 mg total) by mouth daily. 45 tablet 3  . AMBULATORY NON FORMULARY MEDICATION Medication Name: Glucometer, testing strips, and lancets.  Test once one day. 100 Units PRN  . AMBULATORY NON FORMULARY MEDICATION Medication Name: lancets  DX code: E11.9 type 2 DM 100 each prn  . aspirin (ASPIR-LOW) 81 MG EC tablet Take 81 mg by mouth daily.      . Cholecalciferol (VITAMIN D3 PO) Take 1 capsule by mouth. Taking once a week    . Colchicine 0.6 MG CAPS 2 on Day 1, then one a day PRN gout flare.  Ok to sub generic or tabs if cheaper. 30 capsule 11  . fish oil-omega-3 fatty acids 1000 MG capsule Take 4 mg by mouth daily.     . hydrochlorothiazide (HYDRODIURIL) 25 MG tablet Take 1 tablet (25 mg total) by mouth daily. 90  tablet 1  . ketoconazole (NIZORAL) 2 % shampoo APPLY 1 APPLICATION TOPICALLY TWICE WEEKLY. USE DAILY FOR 1 WEEK 120 mL 2  . lisinopril (ZESTRIL) 40 MG tablet TAKE ONE TABLET BY MOUTH ONE TIME DAILY  90 tablet 1  . metFORMIN (GLUCOPHAGE) 1000 MG tablet TAKE 1 TABLET BY MOUTH IN THE MORNING, 1/2 TABLET AT LUNCH, AND 1 TABLET IN THE EVENING 225 tablet 0  . simvastatin (ZOCOR) 40 MG tablet TAKE ONE TABLET BY MOUTH AT BEDTIME  90 tablet 3  . TRUE METRIX BLOOD GLUCOSE TEST test strip USE AS DIRECTED TO TEST ONCE DAILY 100 each 10  . TRUEPLUS LANCETS 30G MISC USE AS DIRECTED TO TEST BLOOD SUGAR ONCE DAILY 100 each PRN  . TURMERIC PO Take 1,000 mg by mouth 2 (two) times daily.     No facility-administered medications prior to visit.    No Known Allergies  ROS Review of Systems    Objective:    Physical Exam  Constitutional: He is oriented to person, place, and time. He appears well-developed and well-nourished.  HENT:  Head: Normocephalic and atraumatic.  Cardiovascular: Normal rate, regular rhythm and normal heart sounds.  No carotid bruits.  Pulmonary/Chest: Effort normal and breath sounds normal.  Neurological: He is alert and oriented to person, place, and time.  Skin: Skin is warm and dry.  Psychiatric: He has a normal mood and affect. His behavior is normal.    BP 132/72   Pulse 60   Ht 5\' 11"  (1.803 m)   Wt 253 lb (114.8 kg)   SpO2 99%   BMI 35.29 kg/m  Wt Readings from Last 3 Encounters:  05/12/19 253 lb (114.8 kg)  02/10/19 253 lb (114.8 kg)  11/06/18 252 lb (114.3 kg)     There are no preventive care reminders to display for this patient.  There are no preventive care reminders to display for this patient.  No results found for: TSH No results found for: WBC, HGB, HCT, MCV, PLT Lab Results  Component Value Date   NA 139 07/02/2018   K 4.6 07/02/2018   CO2 30 07/02/2018   GLUCOSE 136 (H) 07/02/2018   BUN 14 07/02/2018   CREATININE 0.85 07/02/2018   BILITOT  0.4 07/02/2018   ALKPHOS 50 06/21/2016   AST 28 07/02/2018   ALT 36 07/02/2018   PROT 6.3 07/02/2018   ALBUMIN 4.6 06/21/2016   CALCIUM 9.6 07/02/2018   Lab Results  Component Value  Date   CHOL 155 07/02/2018   Lab Results  Component Value Date   HDL 45 07/02/2018   Lab Results  Component Value Date   LDLCALC 86 07/02/2018   Lab Results  Component Value Date   TRIG 143 07/02/2018   Lab Results  Component Value Date   CHOLHDL 3.4 07/02/2018   Lab Results  Component Value Date   HGBA1C 7.9 (A) 05/12/2019      Assessment & Plan:   Problem List Items Addressed This Visit      Cardiovascular and Mediastinum   ESSENTIAL HYPERTENSION, BENIGN - Primary    Well controlled. Continue current regimen. Follow up in  4 mo      Relevant Orders   COMPLETE METABOLIC PANEL WITH GFR   Lipid panel   PSA     Endocrine   Type 2 diabetes mellitus with hyperlipidemia ([place])    Uncontrolled today.  A1c is up to 7.9.  He really does not like he had any major dietary changes but exercise has dropped off so we discussed getting back into some type of routine with that.  It can make a really big difference in those numbers.  We also discussed adding a second medication to his Metformin.      Relevant Medications   canagliflozin (INVOKANA) 100 MG TABS tablet   Other Relevant Orders   POCT HgB A1C (Completed)   COMPLETE METABOLIC PANEL WITH GFR   Lipid panel   PSA     Other   Gout    Really well on current regimen.       Other Visit Diagnoses    Screening for prostate cancer       Relevant Orders   PSA      Meds ordered this encounter  Medications  . canagliflozin (INVOKANA) 100 MG TABS tablet    Sig: Take 1 tablet (100 mg total) by mouth daily before breakfast.    Dispense:  30 tablet    Refill:  3    Follow-up: Return in about 3 months (around 08/10/2019) for Diabetes follow-up.    Beatrice Lecher, MD

## 2019-05-12 NOTE — Patient Instructions (Signed)
Go for labs in February.

## 2019-06-17 ENCOUNTER — Telehealth: Payer: Self-pay

## 2019-06-17 MED ORDER — FARXIGA 10 MG PO TABS: 10.0000 mg | ORAL_TABLET | Freq: Every day | ORAL | 1 refills | Status: DC

## 2019-06-17 NOTE — Telephone Encounter (Signed)
Pt reports that he received a letter from his insurance company that Invokana 100 mg is not covered under his plan and he can try either Jardiance or Wilder Glade (these are Tier 3 medications on his plan) or he can ask for an exception of the Invokana if no change is made switch.   I looked back at his medication history and neither of these medications have been prescribed for him. I asked him if he would be ok with trying either one of these and he said that would be fine and stated that he has enough of the Invokana to last him until the end of the month.   Pt advised that I would send this message to Dr. Madilyn Fireman and she would make a decision as to which medication she will send to his pharmacy. Also advised that if he tries and fails either of these we could then send this information to his insurance company and they would then allow him to get the [place].   Pt voiced understanding and agreed to this. Will need to send new script to [facility].  Will fwd to pcp for advice.Elouise Munroe, [place]

## 2019-06-17 NOTE — Telephone Encounter (Signed)
OK Farxiga sent to [facility].

## 2019-06-17 NOTE — Telephone Encounter (Signed)
lvm advising pt to finish Invokana and then start [place]. Asked that he either call back or send my chart if he has any issues with medication before his next appt on 08/10/2019.[name], [name], CMA

## 2019-06-17 NOTE — Telephone Encounter (Signed)
Horatio requests a return call from [place].

## 2019-06-27 ENCOUNTER — Other Ambulatory Visit: Payer: Self-pay | Admitting: Family Medicine

## 2019-06-27 ENCOUNTER — Ambulatory Visit: Payer: Medicare Other | Attending: Internal Medicine

## 2019-06-27 DIAGNOSIS — Z23 Encounter for immunization: Secondary | ICD-10-CM

## 2019-06-27 NOTE — Progress Notes (Signed)
   Covid-19 Vaccination Clinic  Name:  Aaron Horn    MRN: [id number] DOB: Oct 17, 1945  06/27/2019  [name] was observed post Covid-19 immunization for 15 minutes without incidence. He was provided with Vaccine Information Sheet and instruction to access the V-Safe system.   [name] was instructed to call 911 with any severe reactions post vaccine: Marland Kitchen Difficulty breathing  . Swelling of your face and throat  . A fast heartbeat  . A bad rash all over your body  . Dizziness and weakness    Immunizations Administered    Name Date Dose VIS Date Route   Pfizer COVID-19 Vaccine 06/27/2019  2:34 PM 0.3 mL 04/24/2019 Intramuscular   Manufacturer: [place]   Lot: [id number]   [place]: [id number]

## 2019-06-30 DIAGNOSIS — Z961 Presence of intraocular lens: Secondary | ICD-10-CM | POA: Diagnosis not present

## 2019-07-01 DIAGNOSIS — E1169 Type 2 diabetes mellitus with other specified complication: Secondary | ICD-10-CM | POA: Diagnosis not present

## 2019-07-01 DIAGNOSIS — I10 Essential (primary) hypertension: Secondary | ICD-10-CM | POA: Diagnosis not present

## 2019-07-01 DIAGNOSIS — E785 Hyperlipidemia, unspecified: Secondary | ICD-10-CM | POA: Diagnosis not present

## 2019-07-02 LAB — COMPLETE METABOLIC PANEL WITH GFR
AG Ratio: 2.1 (calc) (ref 1.0–2.5)
ALT: 67 U/L — ABNORMAL HIGH (ref 9–46)
AST: 50 U/L — ABNORMAL HIGH (ref 10–35)
Albumin: 4.7 g/dL (ref 3.6–5.1)
Alkaline phosphatase (APISO): 64 U/L (ref 35–144)
BUN: 21 mg/dL (ref 7–25)
CO2: 29 mmol/L (ref 20–32)
Calcium: 9.9 mg/dL (ref 8.6–10.3)
Chloride: 100 mmol/L (ref 98–110)
Creat: 0.97 mg/dL (ref 0.70–1.18)
GFR, Est African American: 88 mL/min/{1.73_m2} (ref >=60)
GFR, Est Non African American: 76 mL/min/{1.73_m2} (ref >=60)
Globulin: 2.2 g/dL (calc) (ref 1.9–3.7)
Glucose, Bld: 148 mg/dL — ABNORMAL HIGH (ref 65–99)
Potassium: 4.9 mmol/L (ref 3.5–5.3)
Sodium: 139 mmol/L (ref 135–146)
Total Bilirubin: 0.5 mg/dL (ref 0.2–1.2)
Total Protein: 6.9 g/dL (ref 6.1–8.1)

## 2019-07-02 LAB — LIPID PANEL
Cholesterol: 169 mg/dL (ref <200)
HDL: 37 mg/dL — ABNORMAL LOW (ref >=40)
LDL Cholesterol (Calc): 95 mg/dL (calc)
Non-HDL Cholesterol (Calc): 132 mg/dL (calc) — ABNORMAL HIGH (ref <130)
Total CHOL/HDL Ratio: 4.6 (calc) (ref <5.0)
Triglycerides: 247 mg/dL — ABNORMAL HIGH (ref <150)

## 2019-07-02 LAB — PSA: PSA: 4.5 ng/mL — ABNORMAL HIGH

## 2019-07-09 ENCOUNTER — Other Ambulatory Visit: Payer: Self-pay | Admitting: Family Medicine

## 2019-07-17 ENCOUNTER — Other Ambulatory Visit: Payer: Self-pay | Admitting: Family Medicine

## 2019-07-20 ENCOUNTER — Ambulatory Visit: Payer: Medicare Other | Attending: Internal Medicine

## 2019-07-20 DIAGNOSIS — Z23 Encounter for immunization: Secondary | ICD-10-CM

## 2019-07-20 NOTE — Progress Notes (Signed)
   Covid-19 Vaccination Clinic  Name:  Adrius Lindert    MRN: [id number] DOB: 04-12-1946  07/20/2019  Mr. Covill was observed post Covid-19 immunization for 15 minutes without incident. He was provided with Vaccine Information Sheet and instruction to access the V-Safe system.   Mr. Laduca was instructed to call 911 with any severe reactions post vaccine: Marland Kitchen Difficulty breathing  . Swelling of face and throat  . A fast heartbeat  . A bad rash all over body  . Dizziness and weakness   Immunizations Administered    Name Date Dose VIS Date Route   Pfizer COVID-19 Vaccine 07/20/2019  1:23 PM 0.3 mL 04/24/2019 Intramuscular   Manufacturer: [place]   Lot: [id number]   [place]: [id number]

## 2019-07-21 ENCOUNTER — Other Ambulatory Visit: Payer: Self-pay | Admitting: Family Medicine

## 2019-08-01 ENCOUNTER — Other Ambulatory Visit: Payer: Self-pay | Admitting: Family Medicine

## 2019-08-10 ENCOUNTER — Encounter: Payer: Self-pay | Admitting: Family Medicine

## 2019-08-10 ENCOUNTER — Other Ambulatory Visit: Payer: Self-pay

## 2019-08-10 ENCOUNTER — Ambulatory Visit (INDEPENDENT_AMBULATORY_CARE_PROVIDER_SITE_OTHER): Payer: Medicare Other | Admitting: Family Medicine

## 2019-08-10 VITALS — BP 132/64 | HR 62 | Temp 98.1°F | Ht 71.0 in | Wt 244.1 lb

## 2019-08-10 DIAGNOSIS — I10 Essential (primary) hypertension: Secondary | ICD-10-CM | POA: Diagnosis not present

## 2019-08-10 DIAGNOSIS — E1169 Type 2 diabetes mellitus with other specified complication: Secondary | ICD-10-CM | POA: Diagnosis not present

## 2019-08-10 DIAGNOSIS — R7401 Elevation of levels of liver transaminase levels: Secondary | ICD-10-CM | POA: Diagnosis not present

## 2019-08-10 DIAGNOSIS — E785 Hyperlipidemia, unspecified: Secondary | ICD-10-CM

## 2019-08-10 DIAGNOSIS — E669 Obesity, unspecified: Secondary | ICD-10-CM | POA: Diagnosis not present

## 2019-08-10 DIAGNOSIS — Z6834 Body mass index (BMI) 34.0-34.9, adult: Secondary | ICD-10-CM

## 2019-08-10 LAB — POCT GLYCOSYLATED HEMOGLOBIN (HGB A1C): Hemoglobin A1C: 7.1 % — AB (ref 4.0–5.6)

## 2019-08-10 MED ORDER — GLIPIZIDE ER 2.5 MG PO TB24: 2.5000 mg | ORAL_TABLET | Freq: Every day | ORAL | 2 refills | Status: DC

## 2019-08-10 NOTE — Assessment & Plan Note (Signed)
Well controlled. Continue current regimen. Follow up in  3 months.  So excited about his weight loss.

## 2019-08-10 NOTE — Assessment & Plan Note (Signed)
Diabetes.  A1c actually looks much better today at 7.1 which is down from 7.9 which is great.  He did feel like his blood sugars look more consistent and better on the Invokana versus the [place] but we had to change because of insurance.  So we will stick with the Farxiga 10 mg he is already on the max dose.  Will add 2.5 mg of glipizide with his Metformin and then see him back in 3 months.  This should hopefully get his A1c under 7 we will just need to monitor for any weight gain.  He is actually lost some weight recently and I am really excited about that and just encouraged him to continue to work at it I think it is wonderful.

## 2019-08-10 NOTE — Progress Notes (Signed)
Established Patient Office Visit  Subjective:  Patient ID: Aaron Horn, male    DOB: 1946-03-10  Age: 74 y.o. MRN: [id number]  CC:  Chief Complaint  Patient presents with  . Diabetes    HPI [facility] presents for   Hypertension- Pt denies chest pain, SOB, dizziness, or heart palpitations.  Taking meds as directed w/o problems.  Denies medication side effects.    Diabetes - no hypoglycemic events. No wounds or sores that are not healing well. No increased thirst or urination. Checking glucose at home. Taking medications as prescribed without any side effects.  He notes his blood sugars have been running higher since he switched to [place] because of insurance reasons about a month ago.  Hyperlipidemia  - LDL just under a 100.  Had sent my chart note about possibly changing to a more powerful statin.   Past Medical History:  Diagnosis Date  . AV block, 1st degree   . Dyslipidemia   . FH: colonic polyps   . Gout   . Herpes zoster   . HTN (hypertension)    benign essential  . Impaired fasting glucose     Past Surgical History:  Procedure Laterality Date  . CATARACT EXTRACTION Left 01/2019  . CATARACT EXTRACTION Right 04/2019  . COLONOSCOPY  2002   polyps   . tohphi removal foot  2000    Family History  Problem Relation Age of Onset  . Hypertension Mother   . Esophageal cancer Father     Social History   Socioeconomic History  . Marital status: Married    Spouse name: Not on file  . Number of children: Not on file  . Years of education: Not on file  . Highest education level: Not on file  Occupational History  . Not on file  Tobacco Use  . Smoking status: Former Research scientist (life sciences)  . Smokeless tobacco: Never Used  . Tobacco comment: quit smoking in 1998 after 20 pack yr hx  Substance and Sexual Activity  . Alcohol use: Yes    Comment: 2 etoh a night   . Drug use: Not on file  . Sexual activity: Not on file  Other Topics Concern  . Not on file   Social History Narrative   No regular exercise.    Social Determinants of Health   Financial Resource Strain:   . Difficulty of Paying Living Expenses:   Food Insecurity:   . Worried About Charity fundraiser in the Last Year:   . Arboriculturist in the Last Year:   Transportation Needs:   . Film/video editor (Medical):   Marland Kitchen Lack of Transportation (Non-Medical):   Physical Activity:   . Days of Exercise per Week:   . Minutes of Exercise per Session:   Stress:   . Feeling of Stress :   Social Connections:   . Frequency of Communication with Friends and Family:   . Frequency of Social Gatherings with Friends and Family:   . Attends Religious Services:   . Active Member of Clubs or Organizations:   . Attends Archivist Meetings:   Marland Kitchen Marital Status:   Intimate Partner Violence:   . Fear of Current or Ex-Partner:   . Emotionally Abused:   Marland Kitchen Physically Abused:   . Sexually Abused:     Outpatient Medications Prior to Visit  Medication Sig Dispense Refill  . allopurinol (ZYLOPRIM) 300 MG tablet TAKE 1/2 TABLET BY MOUTH DAILY 45 tablet 0  .  AMBULATORY NON FORMULARY MEDICATION Medication Name: Glucometer, testing strips, and lancets.  Test once one day. 100 Units PRN  . AMBULATORY NON FORMULARY MEDICATION Medication Name: lancets  DX code: E11.9 type 2 DM 100 each prn  . aspirin (ASPIR-LOW) 81 MG EC tablet Take 81 mg by mouth daily.      . Cholecalciferol (VITAMIN D3 PO) Take 1 capsule by mouth. Taking once a week    . Colchicine 0.6 MG CAPS 2 on Day 1, then one a day PRN gout flare.  Ok to sub generic or tabs if cheaper. 30 capsule 11  . dapagliflozin propanediol (FARXIGA) 10 MG TABS tablet Take 10 mg by mouth daily before breakfast. 90 tablet 1  . fish oil-omega-3 fatty acids 1000 MG capsule Take 4 mg by mouth daily.     . hydrochlorothiazide (HYDRODIURIL) 25 MG tablet TAKE ONE TABLET BY MOUTH ONE TIME DAILY  90 tablet 0  . ketoconazole (NIZORAL) 2 % shampoo APPLY  1 APPLICATION TOPICALLY TWICE WEEKLY. USE DAILY FOR 1 WEEK 120 mL 2  . lisinopril (ZESTRIL) 40 MG tablet TAKE ONE TABLET BY MOUTH ONE TIME DAILY  90 tablet 0  . metFORMIN (GLUCOPHAGE) 1000 MG tablet TAKE ONE TABLET BY MOUTH EVERY MORNING, 1/2 TABLET AT LUNCH AND 1 TABLET IN THE EVENING 225 tablet 2  . simvastatin (ZOCOR) 40 MG tablet TAKE ONE TABLET BY MOUTH AT BEDTIME  90 tablet 3  . TRUE METRIX BLOOD GLUCOSE TEST test strip USE AS DIRECTED TO TEST ONCE DAILY 100 each 10  . TRUEplus Lancets 30G MISC USE AS DIRECTED TO TEST BLOOD SUGAR DAILY 100 each PRN   No facility-administered medications prior to visit.    No Known Allergies  ROS Review of Systems    Objective:    Physical Exam  Constitutional: He is oriented to person, place, and time. He appears well-developed and well-nourished.  HENT:  Head: Normocephalic and atraumatic.  Cardiovascular: Normal rate, regular rhythm and normal heart sounds.  Pulmonary/Chest: Effort normal and breath sounds normal.  Neurological: He is alert and oriented to person, place, and time.  Skin: Skin is warm and dry.  Psychiatric: He has a normal mood and affect. His behavior is normal.    BP 132/64 (BP Location: Left Arm, Patient Position: Sitting, Cuff Size: Large)   Pulse 62   Temp 98.1 F (36.7 C) (Oral)   Ht 5\' 11"  (1.803 m)   Wt 244 lb 1.9 oz (110.7 kg)   SpO2 97%   BMI 34.05 kg/m  Wt Readings from Last 3 Encounters:  08/10/19 244 lb 1.9 oz (110.7 kg)  05/12/19 253 lb (114.8 kg)  02/10/19 253 lb (114.8 kg)     Health Maintenance Due  Topic Date Due  . OPHTHALMOLOGY EXAM  03/08/2019  . COLONOSCOPY  05/15/2019    There are no preventive care reminders to display for this patient.  No results found for: TSH No results found for: WBC, HGB, HCT, MCV, PLT Lab Results  Component Value Date   NA 139 07/01/2019   K 4.9 07/01/2019   CO2 29 07/01/2019   GLUCOSE 148 (H) 07/01/2019   BUN 21 07/01/2019   CREATININE 0.97  07/01/2019   BILITOT 0.5 07/01/2019   ALKPHOS 50 06/21/2016   AST 50 (H) 07/01/2019   ALT 67 (H) 07/01/2019   PROT 6.9 07/01/2019   ALBUMIN 4.6 06/21/2016   CALCIUM 9.9 07/01/2019   Lab Results  Component Value Date   CHOL 169 07/01/2019  Lab Results  Component Value Date   HDL 37 (L) 07/01/2019   Lab Results  Component Value Date   LDLCALC 95 07/01/2019   Lab Results  Component Value Date   TRIG 247 (H) 07/01/2019   Lab Results  Component Value Date   CHOLHDL 4.6 07/01/2019   Lab Results  Component Value Date   HGBA1C 7.1 (A) 08/10/2019      Assessment & Plan:     Problem List Items Addressed This Visit      Cardiovascular and Mediastinum   ESSENTIAL HYPERTENSION, BENIGN - Primary    Well controlled. Continue current regimen. Follow up in  3 months.  So excited about his weight loss.          Endocrine   Type 2 diabetes mellitus with hyperlipidemia (H[facility])   Relevant Medications   glipiZIDE (GLUCOTROL XL) 2.5 MG 24 hr tablet   Other Relevant Orders   POCT glycosylated hemoglobin (Hb A1C) (Completed)   Diabetes mellitus type 2 in obese (HCC)    Diabetes.  A1c actually looks much better today at 7.1 which is down from 7.9 which is great.  He did feel like his blood sugars look more consistent and better on the Invokana versus the [place] but we had to change because of insurance.  So we will stick with the Farxiga 10 mg he is already on the max dose.  Will add 2.5 mg of glipizide with his Metformin and then see him back in 3 months.  This should hopefully get his A1c under 7 we will just need to monitor for any weight gain.  He is actually lost some weight recently and I am really excited about that and just encouraged him to continue to work at it I think it is wonderful.      Relevant Medications   glipiZIDE (GLUCOTROL XL) 2.5 MG 24 hr tablet   Other Relevant Orders   POCT glycosylated hemoglobin (Hb A1C) (Completed)    Other Visit Diagnoses    Elevated  liver transaminase level       Relevant Orders   Hepatic function panel   BMI 34.0-34.9,adult         Elevated liver function  - due to recheck liver enzymes.    BMI 34-congratulated him on the weight loss he is really just doing fantastic just encouraged him to continue to work at it reminded him that this does reduce his insulin resistance and helps improve his diabetes.  Meds ordered this encounter  Medications  . glipiZIDE (GLUCOTROL XL) 2.5 MG 24 hr tablet    Sig: Take 1 tablet (2.5 mg total) by mouth daily with breakfast.    Dispense:  30 tablet    Refill:  2    Follow-up: Return in about 3 months (around 11/10/2019) for Diabetes follow-up.    Beatrice Lecher, MD

## 2019-08-11 LAB — HEPATIC FUNCTION PANEL
AG Ratio: 1.7 (calc) (ref 1.0–2.5)
ALT: 41 U/L (ref 9–46)
AST: 25 U/L (ref 10–35)
Albumin: 4.3 g/dL (ref 3.6–5.1)
Alkaline phosphatase (APISO): 66 U/L (ref 35–144)
Bilirubin, Direct: 0.1 mg/dL (ref 0.0–0.2)
Globulin: 2.6 g/dL (calc) (ref 1.9–3.7)
Indirect Bilirubin: 0.3 mg/dL (calc) (ref 0.2–1.2)
Total Bilirubin: 0.4 mg/dL (ref 0.2–1.2)
Total Protein: 6.9 g/dL (ref 6.1–8.1)

## 2019-09-25 ENCOUNTER — Telehealth: Payer: Self-pay | Admitting: Family Medicine

## 2019-09-25 NOTE — Telephone Encounter (Signed)
Call pt: he is due for colon cancer screening.  What would he like to do?

## 2019-09-28 NOTE — Telephone Encounter (Signed)
Sending to assistant  

## 2019-10-02 ENCOUNTER — Other Ambulatory Visit: Payer: Self-pay | Admitting: *Deleted

## 2019-10-02 MED ORDER — GLIPIZIDE ER 2.5 MG PO TB24: 2.5000 mg | ORAL_TABLET | Freq: Every day | ORAL | 2 refills | Status: DC

## 2019-10-05 NOTE — Telephone Encounter (Signed)
He has an appt scheduled for either June or July.

## 2019-10-13 DIAGNOSIS — D122 Benign neoplasm of ascending colon: Secondary | ICD-10-CM | POA: Diagnosis not present

## 2019-10-13 DIAGNOSIS — K621 Rectal polyp: Secondary | ICD-10-CM | POA: Diagnosis not present

## 2019-10-13 DIAGNOSIS — Z86010 Personal history of colonic polyps: Secondary | ICD-10-CM | POA: Diagnosis not present

## 2019-10-13 DIAGNOSIS — D128 Benign neoplasm of rectum: Secondary | ICD-10-CM | POA: Diagnosis not present

## 2019-10-13 DIAGNOSIS — K5730 Diverticulosis of large intestine without perforation or abscess without bleeding: Secondary | ICD-10-CM | POA: Diagnosis not present

## 2019-10-13 DIAGNOSIS — D125 Benign neoplasm of sigmoid colon: Secondary | ICD-10-CM | POA: Diagnosis not present

## 2019-10-13 DIAGNOSIS — Z1211 Encounter for screening for malignant neoplasm of colon: Secondary | ICD-10-CM | POA: Diagnosis not present

## 2019-10-13 LAB — HM COLONOSCOPY

## 2019-10-15 ENCOUNTER — Other Ambulatory Visit: Payer: Self-pay | Admitting: Family Medicine

## 2019-10-20 ENCOUNTER — Other Ambulatory Visit: Payer: Self-pay | Admitting: Family Medicine

## 2019-10-22 ENCOUNTER — Encounter: Payer: Self-pay | Admitting: Family Medicine

## 2019-10-26 ENCOUNTER — Other Ambulatory Visit: Payer: Self-pay | Admitting: Family Medicine

## 2019-11-17 ENCOUNTER — Encounter: Payer: Self-pay | Admitting: Family Medicine

## 2019-11-17 ENCOUNTER — Other Ambulatory Visit: Payer: Self-pay | Admitting: Family Medicine

## 2019-11-17 ENCOUNTER — Ambulatory Visit (INDEPENDENT_AMBULATORY_CARE_PROVIDER_SITE_OTHER): Payer: Medicare Other | Admitting: Family Medicine

## 2019-11-17 VITALS — BP 132/62 | HR 59 | Ht 71.0 in | Wt 248.0 lb

## 2019-11-17 DIAGNOSIS — M1A09X1 Idiopathic chronic gout, multiple sites, with tophus (tophi): Secondary | ICD-10-CM

## 2019-11-17 DIAGNOSIS — R21 Rash and other nonspecific skin eruption: Secondary | ICD-10-CM | POA: Diagnosis not present

## 2019-11-17 DIAGNOSIS — I10 Essential (primary) hypertension: Secondary | ICD-10-CM | POA: Diagnosis not present

## 2019-11-17 DIAGNOSIS — E669 Obesity, unspecified: Secondary | ICD-10-CM

## 2019-11-17 DIAGNOSIS — L821 Other seborrheic keratosis: Secondary | ICD-10-CM

## 2019-11-17 DIAGNOSIS — E785 Hyperlipidemia, unspecified: Secondary | ICD-10-CM

## 2019-11-17 DIAGNOSIS — E1169 Type 2 diabetes mellitus with other specified complication: Secondary | ICD-10-CM | POA: Diagnosis not present

## 2019-11-17 LAB — POCT GLYCOSYLATED HEMOGLOBIN (HGB A1C): Hemoglobin A1C: 6.4 % — AB (ref 4.0–5.6)

## 2019-11-17 LAB — BASIC METABOLIC PANEL WITH GFR
BUN: 25 mg/dL (ref 7–25)
CO2: 28 mmol/L (ref 20–32)
Calcium: 10.1 mg/dL (ref 8.6–10.3)
Chloride: 101 mmol/L (ref 98–110)
Creat: 1.04 mg/dL (ref 0.70–1.18)
GFR, Est African American: 81 mL/min/{1.73_m2} (ref >=60)
GFR, Est Non African American: 70 mL/min/{1.73_m2} (ref >=60)
Glucose, Bld: 145 mg/dL — ABNORMAL HIGH (ref 65–99)
Potassium: 4.2 mmol/L (ref 3.5–5.3)
Sodium: 137 mmol/L (ref 135–146)

## 2019-11-17 LAB — URIC ACID: Uric Acid, Serum: 5.8 mg/dL (ref 4.0–8.0)

## 2019-11-17 MED ORDER — KETOCONAZOLE 2 % EX CREA: 1.0000 "application " | TOPICAL_CREAM | Freq: Two times a day (BID) | CUTANEOUS | 1 refills | Status: DC

## 2019-11-17 NOTE — Progress Notes (Signed)
Established Patient Office Visit  Subjective:  Patient ID: Aaron Horn, male    DOB: 1945/05/26  Age: 74 y.o. MRN: [id number]  CC:  Chief Complaint  Patient presents with  . Diabetes  . Hypertension    HPI [facility] presents for   Hypertension- Pt denies chest pain, SOB, dizziness, or heart palpitations.  Taking meds as directed w/o problems.  Denies medication side effects.    Diabetes - no hypoglycemic events. No wounds or sores that are not healing well. No increased thirst or urination. Checking glucose at home. Taking medications as prescribed without any side effects.  He feels like he is doing well with the addition of the glipizide but has noticed that he has developed a rash on his forehead and face similar to the rash that he had on his chest previously he says is not very bothersome its not itchy painful or burning.  But he has noticed is definitely gotten worse since starting the glipizide.  He also has a lesion near the left collarbone that he would like me to look at today he would like to have it frozen if possible.  Past Medical History:  Diagnosis Date  . AV block, 1st degree   . Dyslipidemia   . FH: colonic polyps   . Gout   . Herpes zoster   . HTN (hypertension)    benign essential  . Impaired fasting glucose     Past Surgical History:  Procedure Laterality Date  . CATARACT EXTRACTION Left 01/2019  . CATARACT EXTRACTION Right 04/2019  . COLONOSCOPY  2002   polyps   . tohphi removal foot  2000    Family History  Problem Relation Age of Onset  . Hypertension Mother   . Esophageal cancer Father     Social History   Socioeconomic History  . Marital status: Married    Spouse name: Not on file  . Number of children: Not on file  . Years of education: Not on file  . Highest education level: Not on file  Occupational History  . Not on file  Tobacco Use  . Smoking status: Former Research scientist (life sciences)  . Smokeless tobacco: Never Used  . Tobacco  comment: quit smoking in 1998 after 20 pack yr hx  Substance and Sexual Activity  . Alcohol use: Yes    Comment: 2 etoh a night   . Drug use: Not on file  . Sexual activity: Not on file  Other Topics Concern  . Not on file  Social History Narrative   No regular exercise.    Social Determinants of Health   Financial Resource Strain:   . Difficulty of Paying Living Expenses:   Food Insecurity:   . Worried About Charity fundraiser in the Last Year:   . Arboriculturist in the Last Year:   Transportation Needs:   . Film/video editor (Medical):   Marland Kitchen Lack of Transportation (Non-Medical):   Physical Activity:   . Days of Exercise per Week:   . Minutes of Exercise per Session:   Stress:   . Feeling of Stress :   Social Connections:   . Frequency of Communication with Friends and Family:   . Frequency of Social Gatherings with Friends and Family:   . Attends Religious Services:   . Active Member of Clubs or Organizations:   . Attends Archivist Meetings:   Marland Kitchen Marital Status:   Intimate Partner Violence:   . Fear of  Current or Ex-Partner:   . Emotionally Abused:   Marland Kitchen Physically Abused:   . Sexually Abused:     Outpatient Medications Prior to Visit  Medication Sig Dispense Refill  . allopurinol (ZYLOPRIM) 300 MG tablet TAKE ONE-HALF TABLET BY MOUTH DAILY  45 tablet 3  . aspirin (ASPIR-LOW) 81 MG EC tablet Take 81 mg by mouth daily.      . Cholecalciferol (VITAMIN D3 PO) Take 1 capsule by mouth. Taking once a week    . Colchicine 0.6 MG CAPS 2 on Day 1, then one a day PRN gout flare.  Ok to sub generic or tabs if cheaper. 30 capsule 11  . dapagliflozin propanediol (FARXIGA) 10 MG TABS tablet Take 10 mg by mouth daily before breakfast. 90 tablet 1  . fish oil-omega-3 fatty acids 1000 MG capsule Take 4 mg by mouth daily.     Marland Kitchen glipiZIDE (GLUCOTROL XL) 2.5 MG 24 hr tablet Take 1 tablet (2.5 mg total) by mouth daily with breakfast. 90 tablet 2  . hydrochlorothiazide  (HYDRODIURIL) 25 MG tablet TAKE ONE TABLET BY MOUTH ONE TIME DAILY  90 tablet 1  . ketoconazole (NIZORAL) 2 % shampoo APPLY 1 APPLICATION TOPICALLY TWICE WEEKLY. USE DAILY FOR 1 WEEK 120 mL 2  . lisinopril (ZESTRIL) 40 MG tablet TAKE ONE TABLET BY MOUTH ONE TIME DAILY 90 tablet 1  . metFORMIN (GLUCOPHAGE) 1000 MG tablet TAKE ONE TABLET BY MOUTH EVERY MORNING, 1/2 TABLET AT LUNCH AND 1 TABLET IN THE EVENING 225 tablet 2  . simvastatin (ZOCOR) 40 MG tablet TAKE ONE TABLET BY MOUTH AT BEDTIME  90 tablet 3  . TRUE METRIX BLOOD GLUCOSE TEST test strip USE AS DIRECTED TO TEST ONCE DAILY  100 each 12  . TRUEplus Lancets 30G MISC USE AS DIRECTED TO TEST BLOOD SUGAR DAILY 100 each PRN  . AMBULATORY NON FORMULARY MEDICATION Medication Name: Glucometer, testing strips, and lancets.  Test once one day. 100 Units PRN  . AMBULATORY NON FORMULARY MEDICATION Medication Name: lancets  DX code: E11.9 type 2 DM 100 each prn   No facility-administered medications prior to visit.    No Known Allergies  ROS Review of Systems    Objective:    Physical Exam Constitutional:      Appearance: He is well-developed.  HENT:     Head: Normocephalic and atraumatic.  Cardiovascular:     Rate and Rhythm: Normal rate and regular rhythm.     Heart sounds: Normal heart sounds.  Pulmonary:     Effort: Pulmonary effort is normal.     Breath sounds: Normal breath sounds.  Skin:    General: Skin is warm and dry.     Comments: Mildly erythematous macular rash with fine scale distally over the forehead and eyebrow area going up to the hairline.  Does extend to behind the ears as well.  Similar areas on his chest..  Neurological:     Mental Status: He is alert and oriented to person, place, and time.  Psychiatric:        Behavior: Behavior normal.     BP 132/62   Pulse (!) 59   Ht 5\' 11"  (1.803 m)   Wt 248 lb (112.5 kg)   SpO2 97%   BMI 34.59 kg/m  Wt Readings from Last 3 Encounters:  11/17/19 248 lb (112.5  kg)  08/10/19 244 lb 1.9 oz (110.7 kg)  05/12/19 253 lb (114.8 kg)     There are no preventive care reminders to  display for this patient.  There are no preventive care reminders to display for this patient.  No results found for: TSH No results found for: WBC, HGB, HCT, MCV, PLT Lab Results  Component Value Date   NA 139 07/01/2019   K 4.9 07/01/2019   CO2 29 07/01/2019   GLUCOSE 148 (H) 07/01/2019   BUN 21 07/01/2019   CREATININE 0.97 07/01/2019   BILITOT 0.4 08/10/2019   ALKPHOS 50 06/21/2016   AST 25 08/10/2019   ALT 41 08/10/2019   PROT 6.9 08/10/2019   ALBUMIN 4.6 06/21/2016   CALCIUM 9.9 07/01/2019   Lab Results  Component Value Date   CHOL 169 07/01/2019   Lab Results  Component Value Date   HDL 37 (L) 07/01/2019   Lab Results  Component Value Date   LDLCALC 95 07/01/2019   Lab Results  Component Value Date   TRIG 247 (H) 07/01/2019   Lab Results  Component Value Date   CHOLHDL 4.6 07/01/2019   Lab Results  Component Value Date   HGBA1C 6.4 (A) 11/17/2019      Assessment & Plan:   Problem List Items Addressed This Visit      Cardiovascular and Mediastinum   ESSENTIAL HYPERTENSION, BENIGN    He looks great overall today.  Continue current regimen.  Due for BMP.      Relevant Orders   POCT glycosylated hemoglobin (Hb A1C) (Completed)   BASIC METABOLIC PANEL WITH GFR     Endocrine   Type 2 diabetes mellitus with hyperlipidemia (HCC) - Primary    A1c looks phenomenal today at 6.4 down from previous of 7.1 is made great progress.  Continue current regimen.  He is on a daily low-dose aspirin, ACE inhibitor, and statin.      Relevant Orders   POCT glycosylated hemoglobin (Hb A1C) (Completed)   BASIC METABOLIC PANEL WITH GFR   Diabetes mellitus type 2 in obese [facility])    He says overall he has been trying to really maintain his weight he is up a couple pounds since I last saw him so we will need to monitor that carefully special with recent  addition of glipizide which can cause weight gain we discussed continue to work on healthy diet and regular exercise and excited about his A1c reduction.  But I do want to make sure that his weight is not increasing on the glipizide.        Other   Gout   Relevant Orders   Uric acid    Other Visit Diagnoses    Skin rash       Relevant Medications   ketoconazole (NIZORAL) 2 % cream   Other Relevant Orders   Fungal Stain   Keratosis, seborrheic          Skin rash-most consistent with seborrheic dermatitis we will treat with topical ketoconazole cream.  If not improving then please let me know.  The glipizide could be contributing it is a little bit unclear but like to try to tx first. .  Seborrheic keratosis-cryotherapy performed.  Patient tolerated well.  Cryotherapy Procedure Note  Pre-operative Diagnosis: seb keratosis  Post-operative Diagnosis: same  Locations: left collar bone  Indications: irritation at collar  Procedure Details  Patient informed of risks (permanent scarring, infection, light or dark discoloration, bleeding, infection, weakness, numbness and recurrence of the lesion) and benefits of the procedure and verbal informed consent obtained.  The areas are treated with liquid nitrogen therapy, frozen until ice ball extended  1-2 mm beyond lesion, allowed to thaw, and treated again. The patient tolerated procedure well.  The patient was instructed on post-op care, warned that there may be blister formation, redness and pain. Recommend OTC analgesia as needed for pain.  Condition: Stable  Complications: none.  Plan: 1. Instructed to keep the area dry and covered for 24-48h and clean thereafter. 2. Warning signs of infection were reviewed.   3. Recommended that the patient use OTC acetaminophen as needed for pain.  4. Return PRN.     Meds ordered this encounter  Medications  . ketoconazole (NIZORAL) 2 % cream    Sig: Apply 1 application topically 2 (two)  times daily. X 3 weeks    Dispense:  30 g    Refill:  1    Follow-up: Return in about 3 months (around 02/17/2020) for Diabetes follow-up.    Beatrice Lecher, MD

## 2019-11-17 NOTE — Assessment & Plan Note (Signed)
He says overall he has been trying to really maintain his weight he is up a couple pounds since I last saw him so we will need to monitor that carefully special with recent addition of glipizide which can cause weight gain we discussed continue to work on healthy diet and regular exercise and excited about his A1c reduction.  But I do want to make sure that his weight is not increasing on the glipizide.

## 2019-11-17 NOTE — Assessment & Plan Note (Signed)
He looks great overall today.  Continue current regimen.  Due for BMP.

## 2019-11-17 NOTE — Progress Notes (Signed)
All labs are normal. 

## 2019-11-17 NOTE — Assessment & Plan Note (Signed)
A1c looks phenomenal today at 6.4 down from previous of 7.1 is made great progress.  Continue current regimen.  He is on a daily low-dose aspirin, ACE inhibitor, and statin.

## 2019-11-19 LAB — FUNGAL STAIN
FUNGAL SMEAR:: NONE SEEN
MICRO NUMBER:: 10671477
SPECIMEN QUALITY:: ADEQUATE

## 2019-12-20 ENCOUNTER — Other Ambulatory Visit: Payer: Self-pay | Admitting: Family Medicine

## 2020-02-17 ENCOUNTER — Ambulatory Visit (INDEPENDENT_AMBULATORY_CARE_PROVIDER_SITE_OTHER): Payer: Medicare Other | Admitting: Family Medicine

## 2020-02-17 ENCOUNTER — Encounter: Payer: Self-pay | Admitting: Family Medicine

## 2020-02-17 VITALS — BP 144/70 | HR 62 | Temp 97.8°F | Wt 251.0 lb

## 2020-02-17 DIAGNOSIS — I10 Essential (primary) hypertension: Secondary | ICD-10-CM | POA: Diagnosis not present

## 2020-02-17 DIAGNOSIS — E785 Hyperlipidemia, unspecified: Secondary | ICD-10-CM | POA: Diagnosis not present

## 2020-02-17 DIAGNOSIS — Z23 Encounter for immunization: Secondary | ICD-10-CM | POA: Diagnosis not present

## 2020-02-17 DIAGNOSIS — M1A09X1 Idiopathic chronic gout, multiple sites, with tophus (tophi): Secondary | ICD-10-CM

## 2020-02-17 DIAGNOSIS — R040 Epistaxis: Secondary | ICD-10-CM

## 2020-02-17 DIAGNOSIS — E1169 Type 2 diabetes mellitus with other specified complication: Secondary | ICD-10-CM

## 2020-02-17 LAB — POCT GLYCOSYLATED HEMOGLOBIN (HGB A1C): Hemoglobin A1C: 6.4 % — AB (ref 4.0–5.6)

## 2020-02-17 NOTE — Progress Notes (Signed)
Established Patient Office Visit  Subjective:  Patient ID: Aaron Horn, male    DOB: 11/22/1945  Age: 74 y.o. MRN: [id number]  CC:  Chief Complaint  Patient presents with  . Diabetes    HPI Aaron Horn presents for   Hypertension- Pt denies chest pain, SOB, dizziness, or heart palpitations.  Taking meds as directed w/o problems.  Denies medication side effects.    Diabetes - no hypoglycemic events. No wounds or sores that are not healing well. No increased thirst or urination. Checking glucose at home. Taking medications as prescribed without any side effects. Did let me know that he ran out of the [place] not long after last saw him so has been off of it for about 2-1/2 months he actually felt like his sugars look better off of it so he is just been taking the Metformin and the glipizide and feels like that seems to be working well for him.  He has been having some nosebleeds he says they were recurrent when he was a child and then went away during most of his adulthood but more recently they have started back up particularly in his left nostril.  Past Medical History:  Diagnosis Date  . AV block, 1st degree   . Dyslipidemia   . FH: colonic polyps   . Gout   . Herpes zoster   . HTN (hypertension)    benign essential  . Impaired fasting glucose     Past Surgical History:  Procedure Laterality Date  . CATARACT EXTRACTION Left 01/2019  . CATARACT EXTRACTION Right 04/2019  . COLONOSCOPY  2002   polyps   . tohphi removal foot  2000    Family History  Problem Relation Age of Onset  . Hypertension Mother   . Esophageal cancer Father     Social History   Socioeconomic History  . Marital status: Married    Spouse name: Not on file  . Number of children: Not on file  . Years of education: Not on file  . Highest education level: Not on file  Occupational History  . Not on file  Tobacco Use  . Smoking status: Former Research scientist (life sciences)  . Smokeless tobacco: Never  Used  . Tobacco comment: quit smoking in 1998 after 20 pack yr hx  Substance and Sexual Activity  . Alcohol use: Yes    Comment: 2 etoh a night   . Drug use: Not on file  . Sexual activity: Not on file  Other Topics Concern  . Not on file  Social History Narrative   No regular exercise.    Social Determinants of Health   Financial Resource Strain:   . Difficulty of Paying Living Expenses: Not on file  Food Insecurity:   . Worried About Charity fundraiser in the Last Year: Not on file  . Ran Out of Food in the Last Year: Not on file  Transportation Needs:   . Lack of Transportation (Medical): Not on file  . Lack of Transportation (Non-Medical): Not on file  Physical Activity:   . Days of Exercise per Week: Not on file  . Minutes of Exercise per Session: Not on file  Stress:   . Feeling of Stress : Not on file  Social Connections:   . Frequency of Communication with Friends and Family: Not on file  . Frequency of Social Gatherings with Friends and Family: Not on file  . Attends Religious Services: Not on file  . Active Member of  Clubs or Organizations: Not on file  . Attends Archivist Meetings: Not on file  . Marital Status: Not on file  Intimate Partner Violence:   . Fear of Current or Ex-Partner: Not on file  . Emotionally Abused: Not on file  . Physically Abused: Not on file  . Sexually Abused: Not on file    Outpatient Medications Prior to Visit  Medication Sig Dispense Refill  . allopurinol (ZYLOPRIM) 300 MG tablet TAKE ONE-HALF TABLET BY MOUTH DAILY  45 tablet 3  . aspirin (ASPIR-LOW) 81 MG EC tablet Take 81 mg by mouth daily.      . Cholecalciferol (VITAMIN D3 PO) Take 1 capsule by mouth. Taking once a week    . Colchicine 0.6 MG CAPS 2 on Day 1, then one a day PRN gout flare.  Ok to sub generic or tabs if cheaper. 30 capsule 11  . fish oil-omega-3 fatty acids 1000 MG capsule Take 4 mg by mouth daily.     Marland Kitchen glipiZIDE (GLUCOTROL XL) 2.5 MG 24 hr tablet  Take 1 tablet (2.5 mg total) by mouth daily with breakfast. 90 tablet 2  . hydrochlorothiazide (HYDRODIURIL) 25 MG tablet TAKE ONE TABLET BY MOUTH ONE TIME DAILY  90 tablet 1  . ketoconazole (NIZORAL) 2 % cream Apply 1 application topically 2 (two) times daily. X 3 weeks 30 g 1  . ketoconazole (NIZORAL) 2 % shampoo APPLY 1 APPLICATION TOPICALLY TWICE WEEKLY. USE DAILY FOR 1 WEEK 120 mL 2  . lisinopril (ZESTRIL) 40 MG tablet TAKE ONE TABLET BY MOUTH ONE TIME DAILY 90 tablet 1  . metFORMIN (GLUCOPHAGE) 1000 MG tablet TAKE ONE TABLET BY MOUTH EVERY MORNING, 1/2 TABLET AT LUNCH AND 1 TABLET IN THE EVENING 225 tablet 2  . simvastatin (ZOCOR) 40 MG tablet TAKE ONE TABLET BY MOUTH AT BEDTIME  90 tablet 3  . TRUE METRIX BLOOD GLUCOSE TEST test strip USE AS DIRECTED TO TEST ONCE DAILY  100 each 12  . TRUEplus Lancets 30G MISC USE AS DIRECTED TO TEST BLOOD SUGAR DAILY 100 each PRN  . FARXIGA 10 MG TABS tablet TAKE ONE TABLET BY MOUTH ONE TIME DAILY BEFORE BREAKFAST (Patient not taking: Reported on 02/17/2020) 90 tablet 1   No facility-administered medications prior to visit.    No Known Allergies  ROS Review of Systems    Objective:    Physical Exam Constitutional:      Appearance: He is well-developed.  HENT:     Head: Normocephalic and atraumatic.  Cardiovascular:     Rate and Rhythm: Normal rate and regular rhythm.     Heart sounds: Normal heart sounds.  Pulmonary:     Effort: Pulmonary effort is normal.     Breath sounds: Normal breath sounds.  Skin:    General: Skin is warm and dry.  Neurological:     Mental Status: He is alert and oriented to person, place, and time.  Psychiatric:        Behavior: Behavior normal.     BP (!) 144/70 (BP Location: Left Arm, Patient Position: Sitting, Cuff Size: Normal)   Pulse 62   Temp 97.8 F (36.6 C) (Oral)   Wt 251 lb (113.9 kg)   BMI 35.01 kg/m  Wt Readings from Last 3 Encounters:  02/17/20 251 lb (113.9 kg)  11/17/19 248 lb (112.5  kg)  08/10/19 244 lb 1.9 oz (110.7 kg)     There are no preventive care reminders to display for this patient.  There are no preventive care reminders to display for this patient.  No results found for: TSH No results found for: WBC, HGB, HCT, MCV, PLT Lab Results  Component Value Date   NA 137 11/17/2019   K 4.2 11/17/2019   CO2 28 11/17/2019   GLUCOSE 145 (H) 11/17/2019   BUN 25 11/17/2019   CREATININE 1.04 11/17/2019   BILITOT 0.4 08/10/2019   ALKPHOS 50 06/21/2016   AST 25 08/10/2019   ALT 41 08/10/2019   PROT 6.9 08/10/2019   ALBUMIN 4.6 06/21/2016   CALCIUM 10.1 11/17/2019   Lab Results  Component Value Date   CHOL 169 07/01/2019   Lab Results  Component Value Date   HDL 37 (L) 07/01/2019   Lab Results  Component Value Date   LDLCALC 95 07/01/2019   Lab Results  Component Value Date   TRIG 247 (H) 07/01/2019   Lab Results  Component Value Date   CHOLHDL 4.6 07/01/2019   Lab Results  Component Value Date   HGBA1C 6.4 (A) 02/17/2020      Assessment & Plan:   Problem List Items Addressed This Visit      Cardiovascular and Mediastinum   ESSENTIAL HYPERTENSION, BENIGN    Normally BP looks better than today. Will monitor for now.         Endocrine   Type 2 diabetes mellitus with hyperlipidemia ([place])    Well controlled off of [place]. Continue current regimen. Follow up in  4 months.        Relevant Orders   POCT HgB A1C (Completed)     Other   Gout - Primary    Doing well. No recent flares.         Other Visit Diagnoses    Need for influenza vaccination       Relevant Orders   Flu Vaccine QUAD High Dose(Fluad) (Completed)   Nosebleed         Nosebleeds-he does take aspirin and high-dose fish oil which could be causing more easy bleeding and bruising and did discuss that with him he says in fact he actually usually hold his aspirin for a few days if he does get a nosebleed. Discussed that we can refer him to ENT to have the area  cauterized especially he feels like is coming from the same location. Recommend running a humidifier, moisturizing the passages, and avoiding blowing the nose hard.  No orders of the defined types were placed in this encounter.   Follow-up: Return in about 4 months (around 06/19/2020) for Diabetes follow-up.    Beatrice Lecher, MD

## 2020-02-17 NOTE — Assessment & Plan Note (Addendum)
Well controlled off of [place]. Continue current regimen. Follow up in  4 months.

## 2020-02-17 NOTE — Assessment & Plan Note (Signed)
Doing well. No recent flares.

## 2020-02-17 NOTE — Assessment & Plan Note (Signed)
Normally BP looks better than today. Will monitor for now.

## 2020-03-31 ENCOUNTER — Other Ambulatory Visit: Payer: Self-pay | Admitting: *Deleted

## 2020-03-31 ENCOUNTER — Telehealth: Payer: Self-pay

## 2020-03-31 DIAGNOSIS — R040 Epistaxis: Secondary | ICD-10-CM

## 2020-03-31 MED ORDER — GLIPIZIDE ER 2.5 MG PO TB24: 2.5000 mg | ORAL_TABLET | Freq: Every day | ORAL | 2 refills | Status: DC

## 2020-03-31 NOTE — Telephone Encounter (Signed)
lvm asking pt to rtn call.

## 2020-03-31 NOTE — Telephone Encounter (Signed)
Aaron Horn called wanting Aaron Horn to call him back.

## 2020-04-01 NOTE — Telephone Encounter (Signed)
Call pt and he wanted to know about getting his nose cauterized. As per Dr. Gardiner Ramus last [place] note she suggested referring him to ENT. Will place referral for him.

## 2020-04-06 ENCOUNTER — Other Ambulatory Visit: Payer: Self-pay | Admitting: Family Medicine

## 2020-04-15 ENCOUNTER — Other Ambulatory Visit: Payer: Self-pay | Admitting: Family Medicine

## 2020-04-20 DIAGNOSIS — R040 Epistaxis: Secondary | ICD-10-CM | POA: Diagnosis not present

## 2020-04-22 ENCOUNTER — Other Ambulatory Visit: Payer: Self-pay | Admitting: Family Medicine

## 2020-04-22 DIAGNOSIS — E785 Hyperlipidemia, unspecified: Secondary | ICD-10-CM

## 2020-06-13 ENCOUNTER — Other Ambulatory Visit: Payer: Self-pay | Admitting: Family Medicine

## 2020-06-14 DIAGNOSIS — Z961 Presence of intraocular lens: Secondary | ICD-10-CM | POA: Diagnosis not present

## 2020-06-14 DIAGNOSIS — E113213 Type 2 diabetes mellitus with mild nonproliferative diabetic retinopathy with macular edema, bilateral: Secondary | ICD-10-CM | POA: Diagnosis not present

## 2020-06-14 LAB — HM DIABETES EYE EXAM

## 2020-06-20 ENCOUNTER — Other Ambulatory Visit: Payer: Self-pay

## 2020-06-20 ENCOUNTER — Ambulatory Visit (INDEPENDENT_AMBULATORY_CARE_PROVIDER_SITE_OTHER): Payer: Medicare Other | Admitting: Family Medicine

## 2020-06-20 ENCOUNTER — Encounter: Payer: Self-pay | Admitting: Family Medicine

## 2020-06-20 VITALS — BP 121/53 | HR 66 | Ht 66.0 in | Wt 251.0 lb

## 2020-06-20 DIAGNOSIS — D692 Other nonthrombocytopenic purpura: Secondary | ICD-10-CM | POA: Diagnosis not present

## 2020-06-20 DIAGNOSIS — Z125 Encounter for screening for malignant neoplasm of prostate: Secondary | ICD-10-CM | POA: Diagnosis not present

## 2020-06-20 DIAGNOSIS — I10 Essential (primary) hypertension: Secondary | ICD-10-CM | POA: Diagnosis not present

## 2020-06-20 DIAGNOSIS — E785 Hyperlipidemia, unspecified: Secondary | ICD-10-CM | POA: Diagnosis not present

## 2020-06-20 DIAGNOSIS — E1169 Type 2 diabetes mellitus with other specified complication: Secondary | ICD-10-CM | POA: Diagnosis not present

## 2020-06-20 LAB — POCT GLYCOSYLATED HEMOGLOBIN (HGB A1C): Hemoglobin A1C: 6.6 % — AB (ref 4.0–5.6)

## 2020-06-20 MED ORDER — GLIPIZIDE ER 5 MG PO TB24: 5.0000 mg | ORAL_TABLET | Freq: Every day | ORAL | 1 refills | Status: DC

## 2020-06-20 NOTE — Assessment & Plan Note (Signed)
See looks good today at 6.6, though it is up from previous.  He would like to increase his glipizide he feels like he wants his blood sugars a little bit tightly controlled.  Did warn about potential for hypoglycemia but we will go ahead and bump glipizide to 5 mg.  Continue to work on [employer] and staying active.  He is actually been doing great with his diet of the last few weeks.  Also encouraged more regular exercise as well as weight loss.

## 2020-06-20 NOTE — Progress Notes (Signed)
Established Patient Office Visit  Subjective:  Patient ID: Aaron Horn, male    DOB: 1946-03-13  Age: 75 y.o. MRN: [id number]  CC:  Chief Complaint  Patient presents with  . Diabetes  . Hypertension    HPI Aaron Horn presents for   Hypertension- Pt denies chest pain, SOB, dizziness, or heart palpitations.  Taking meds as directed w/o problems.  Denies medication side effects.    Diabetes - no hypoglycemic events. No wounds or sores that are not healing well. No increased thirst or urination. Checking glucose at home. Taking medications as prescribed without any side effects.  He did have the cautery on his nose for recurrent nosebleeds and says he is been doing really well since then.  He actually held his aspirin for the last couple of months and wondering if he should restart it or not.  No prior history of MI or stroke.  He was taking it more for prevention.  He notes he still bruising a lot on his forearms even after being off aspirin.  Though he does take fish oil.  Past Medical History:  Diagnosis Date  . AV block, 1st degree   . Dyslipidemia   . FH: colonic polyps   . Gout   . Herpes zoster   . HTN (hypertension)    benign essential  . Impaired fasting glucose     Past Surgical History:  Procedure Laterality Date  . CATARACT EXTRACTION Left 01/2019  . CATARACT EXTRACTION Right 04/2019  . COLONOSCOPY  2002   polyps   . tohphi removal foot  2000    Family History  Problem Relation Age of Onset  . Hypertension Mother   . Esophageal cancer Father     Social History   Socioeconomic History  . Marital status: Married    Spouse name: Not on file  . Number of children: Not on file  . Years of education: Not on file  . Highest education level: Not on file  Occupational History  . Not on file  Tobacco Use  . Smoking status: Former Research scientist (life sciences)  . Smokeless tobacco: Never Used  . Tobacco comment: quit smoking in 1998 after 20 pack yr hx  Substance  and Sexual Activity  . Alcohol use: Yes    Comment: 2 etoh a night   . Drug use: Not on file  . Sexual activity: Not on file  Other Topics Concern  . Not on file  Social History Narrative   No regular exercise.    Social Determinants of Health   Financial Resource Strain: Not on file  Food Insecurity: Not on file  Transportation Needs: Not on file  Physical Activity: Not on file  Stress: Not on file  Social Connections: Not on file  Intimate Partner Violence: Not on file    Outpatient Medications Prior to Visit  Medication Sig Dispense Refill  . allopurinol (ZYLOPRIM) 300 MG tablet TAKE ONE-HALF TABLET BY MOUTH DAILY  45 tablet 3  . aspirin (ASPIR-LOW) 81 MG EC tablet Take 81 mg by mouth daily.      . Cholecalciferol (VITAMIN D3 PO) Take 1 capsule by mouth. Taking once a week    . Colchicine 0.6 MG CAPS 2 on Day 1, then one a day PRN gout flare.  Ok to sub generic or tabs if cheaper. 30 capsule 11  . fish oil-omega-3 fatty acids 1000 MG capsule Take 4 mg by mouth daily.     . hydrochlorothiazide (HYDRODIURIL) 25 MG  tablet TAKE ONE TABLET BY MOUTH ONE TIME DAILY 90 tablet 1  . ketoconazole (NIZORAL) 2 % cream Apply 1 application topically 2 (two) times daily. X 3 weeks 30 g 1  . ketoconazole (NIZORAL) 2 % shampoo APPLY 1 APPLICATION TOPICALLY TWICE WEEKLY. USE DAILY FOR 1 WEEK 120 mL 2  . lisinopril (ZESTRIL) 40 MG tablet TAKE ONE TABLET BY MOUTH ONE TIME DAILY 90 tablet 0  . metFORMIN (GLUCOPHAGE) 1000 MG tablet TAKE 1 TABLET BY MOUTH EVERY MORNING, 1/2 TABLET AT LUNCH AND 1 TABLET IN THE EVENING 225 tablet 2  . simvastatin (ZOCOR) 40 MG tablet TAKE ONE TABLET BY MOUTH AT BEDTIME 90 tablet 3  . TRUE METRIX BLOOD GLUCOSE TEST test strip USE AS DIRECTED TO TEST ONCE DAILY  100 each 12  . TRUEplus Lancets 30G MISC USE AS DIRECTED TO TEST BLOOD SUGAR DAILY 100 each PRN  . glipiZIDE (GLUCOTROL XL) 2.5 MG 24 hr tablet Take 1 tablet (2.5 mg total) by mouth daily with breakfast. 90 tablet  2   No facility-administered medications prior to visit.    No Known Allergies  ROS Review of Systems    Objective:    Physical Exam Constitutional:      Appearance: He is well-developed and well-nourished.  HENT:     Head: Normocephalic and atraumatic.  Cardiovascular:     Rate and Rhythm: Normal rate and regular rhythm.     Heart sounds: Normal heart sounds.  Pulmonary:     Effort: Pulmonary effort is normal.     Breath sounds: Normal breath sounds.  Skin:    General: Skin is warm and dry.  Neurological:     Mental Status: He is alert and oriented to person, place, and time.  Psychiatric:        Mood and Affect: Mood and affect normal.        Behavior: Behavior normal.     BP (!) 121/53   Pulse 66   Ht 5\' 6"  (1.676 m)   Wt 251 lb (113.9 kg)   SpO2 99%   BMI 40.51 kg/m  Wt Readings from Last 3 Encounters:  06/20/20 251 lb (113.9 kg)  02/17/20 251 lb (113.9 kg)  11/17/19 248 lb (112.5 kg)     Health Maintenance Due  Topic Date Due  . COVID-19 Vaccine (3 - Pfizer risk 4-dose series) 08/17/2019    There are no preventive care reminders to display for this patient.  No results found for: TSH No results found for: WBC, HGB, HCT, MCV, PLT Lab Results  Component Value Date   NA 137 11/17/2019   K 4.2 11/17/2019   CO2 28 11/17/2019   GLUCOSE 145 (H) 11/17/2019   BUN 25 11/17/2019   CREATININE 1.04 11/17/2019   BILITOT 0.4 08/10/2019   ALKPHOS 50 06/21/2016   AST 25 08/10/2019   ALT 41 08/10/2019   PROT 6.9 08/10/2019   ALBUMIN 4.6 06/21/2016   CALCIUM 10.1 11/17/2019   Lab Results  Component Value Date   CHOL 169 07/01/2019   Lab Results  Component Value Date   HDL 37 (L) 07/01/2019   Lab Results  Component Value Date   LDLCALC 95 07/01/2019   Lab Results  Component Value Date   TRIG 247 (H) 07/01/2019   Lab Results  Component Value Date   CHOLHDL 4.6 07/01/2019   Lab Results  Component Value Date   HGBA1C 6.6 (A) 06/20/2020       Assessment & Plan:  Problem List Items Addressed This Visit      Cardiovascular and Mediastinum   Purpura senilis ([place])    He has senile purpura.  Okay to continue to hold aspirin.  He does not need it for prevention.      ESSENTIAL HYPERTENSION, BENIGN    Well controlled. Continue current regimen. Follow up in  4 mo      Relevant Orders   Lipid Panel w/reflex Direct LDL   COMPLETE METABOLIC PANEL WITH GFR     Endocrine   Type 2 diabetes mellitus with hyperlipidemia ([place]) - Primary    See looks good today at 6.6, though it is up from previous.  He would like to increase his glipizide he feels like he wants his blood sugars a little bit tightly controlled.  Did warn about potential for hypoglycemia but we will go ahead and bump glipizide to 5 mg.  Continue to work on [employer] and staying active.  He is actually been doing great with his diet of the last few weeks.  Also encouraged more regular exercise as well as weight loss.      Relevant Medications   glipiZIDE (GLUCOTROL XL) 5 MG 24 hr tablet   Other Relevant Orders   Lipid Panel w/reflex Direct LDL   COMPLETE METABOLIC PANEL WITH GFR   POCT glycosylated hemoglobin (Hb A1C) (Completed)    Other Visit Diagnoses    Screening for prostate cancer       Relevant Orders   PSA      Meds ordered this encounter  Medications  . glipiZIDE (GLUCOTROL XL) 5 MG 24 hr tablet    Sig: Take 1 tablet (5 mg total) by mouth daily with breakfast.    Dispense:  90 tablet    Refill:  1    Follow-up: Return in about 4 months (around 10/18/2020) for Diabetes follow-up.    Beatrice Lecher, MD

## 2020-06-20 NOTE — Assessment & Plan Note (Signed)
Well controlled. Continue current regimen. Follow up in  4 mo 

## 2020-06-20 NOTE — Assessment & Plan Note (Signed)
He has senile purpura.  Okay to continue to hold aspirin.  He does not need it for prevention.

## 2020-06-21 LAB — COMPLETE METABOLIC PANEL WITH GFR
AG Ratio: 1.6 (calc) (ref 1.0–2.5)
ALT: 56 U/L — ABNORMAL HIGH (ref 9–46)
AST: 46 U/L — ABNORMAL HIGH (ref 10–35)
Albumin: 4.4 g/dL (ref 3.6–5.1)
Alkaline phosphatase (APISO): 68 U/L (ref 35–144)
BUN: 23 mg/dL (ref 7–25)
CO2: 28 mmol/L (ref 20–32)
Calcium: 10 mg/dL (ref 8.6–10.3)
Chloride: 101 mmol/L (ref 98–110)
Creat: 0.96 mg/dL (ref 0.70–1.18)
GFR, Est African American: 89 mL/min/{1.73_m2} (ref >=60)
GFR, Est Non African American: 76 mL/min/{1.73_m2} (ref >=60)
Globulin: 2.8 g/dL (calc) (ref 1.9–3.7)
Glucose, Bld: 124 mg/dL — ABNORMAL HIGH (ref 65–99)
Potassium: 4.5 mmol/L (ref 3.5–5.3)
Sodium: 139 mmol/L (ref 135–146)
Total Bilirubin: 0.5 mg/dL (ref 0.2–1.2)
Total Protein: 7.2 g/dL (ref 6.1–8.1)

## 2020-06-21 LAB — LIPID PANEL W/REFLEX DIRECT LDL
Cholesterol: 145 mg/dL (ref <200)
HDL: 34 mg/dL — ABNORMAL LOW (ref >=40)
LDL Cholesterol (Calc): 77 mg/dL (calc)
Non-HDL Cholesterol (Calc): 111 mg/dL (calc) (ref <130)
Total CHOL/HDL Ratio: 4.3 (calc) (ref <5.0)
Triglycerides: 246 mg/dL — ABNORMAL HIGH (ref <150)

## 2020-06-21 LAB — PSA: PSA: 5.75 ng/mL — ABNORMAL HIGH (ref <=4.0)

## 2020-07-13 ENCOUNTER — Other Ambulatory Visit: Payer: Self-pay | Admitting: Family Medicine

## 2020-07-15 ENCOUNTER — Other Ambulatory Visit: Payer: Self-pay | Admitting: Family Medicine

## 2020-07-25 ENCOUNTER — Ambulatory Visit (INDEPENDENT_AMBULATORY_CARE_PROVIDER_SITE_OTHER): Payer: Medicare Other | Admitting: Family Medicine

## 2020-07-25 DIAGNOSIS — Z0000 Encounter for general adult medical examination without abnormal findings: Secondary | ICD-10-CM

## 2020-07-25 NOTE — Patient Instructions (Addendum)
[facility] Maintenance Summary and Written Plan of Care  Mr. Aaron Horn ,  Thank you for allowing me to perform your Medicare Annual Wellness Visit and for your ongoing commitment to your health.   Health Maintenance & Immunization History Health Maintenance  Topic Date Due  . COVID-19 Vaccine (4 - Booster for Pfizer series) 08/20/2020  . HEMOGLOBIN A1C  12/18/2020  . OPHTHALMOLOGY EXAM  06/14/2021  . FOOT EXAM  06/20/2021  . COLONOSCOPY (Pts 45-81yrs Insurance coverage will need to be confirmed)  10/12/2024  . TETANUS/TDAP  07/07/2025  . INFLUENZA VACCINE  Completed  . Hepatitis C Screening  Completed  . PNA vac Low Risk Adult  Completed  . HPV VACCINES  Aged Out   Immunization History  Administered Date(s) Administered  . Fluad Quad(high Dose 65+) 02/10/2019, 02/17/2020  . Influenza Split 02/06/2012  . Influenza Whole 03/02/2008, 02/02/2010  . Influenza, High Dose Seasonal PF 01/17/2017  . Influenza,inj,Quad PF,6+ Mos 03/03/2014, 03/07/2015, 02/06/2016, 12/31/2017  . PFIZER(Purple Top)SARS-COV-2 Vaccination 06/27/2019, 07/20/2019, 02/20/2020  . Pneumococcal Conjugate-13 09/02/2014  . Pneumococcal Polysaccharide-23 07/21/2009  . Td 05/14/2005  . Tdap 07/08/2015    These are the patient goals that we discussed: Goals Addressed              This Visit's Progress   .  Patient Stated (pt-stated)        07/25/2020 AWV Goal: Exercise for General Health   Patient will verbalize understanding of the benefits of increased physical activity:  Exercising regularly is important. It will improve your overall fitness, flexibility, and endurance.  Regular exercise also will improve your overall health. It can help you control your weight, reduce stress, and improve your bone density.  Over the next year, patient will increase physical activity as tolerated with a goal of at least 150 minutes of moderate physical activity per week.   You can tell that  you are exercising at a moderate intensity if your heart starts beating faster and you start breathing faster but can still hold a conversation.  Moderate-intensity exercise ideas include:  Walking 1 mile (1.6 km) in about 15 minutes  Biking  Hiking  Golfing  Dancing  Water aerobics  Patient will verbalize understanding of everyday activities that increase physical activity by providing examples like the following: ? Yard work, such as: ? Pushing a Conservation officer, nature ? Raking and bagging leaves ? Washing your car ? Pushing a stroller ? Shoveling snow ? Gardening ? Washing windows or floors  Patient will be able to explain general safety guidelines for exercising:   Before you start a new exercise program, talk with your health care provider.  Do not exercise so much that you hurt yourself, feel dizzy, or get very short of breath.  Wear comfortable clothes and wear shoes with good support.  Drink plenty of water while you exercise to prevent dehydration or heat stroke.  Work out until your breathing and your heartbeat get faster.         This is a list of Health Maintenance Items that are overdue or due now: Shingrix vaccine  Orders/Referrals Placed Today: No orders of the defined types were placed in this encounter.  (Contact our referral department at [phone] if you have not spoken with someone about your referral appointment within the next 5 days)    Follow-up Plan . Follow-up with Hali Marry, MD as planned . Medicare wellness in one year. . Let us know if you decide  to take the shingrix vaccine.  Health Maintenance, Male Adopting a healthy lifestyle and getting preventive care are important in promoting health and wellness. Ask your health care provider about:  The right schedule for you to have regular tests and exams.  Things you can do on your own to prevent diseases and keep yourself healthy. What should I know about diet, weight, and  exercise? Eat a healthy diet  Eat a diet that includes plenty of vegetables, fruits, low-fat dairy products, and lean protein.  Do not eat a lot of foods that are high in solid fats, added sugars, or sodium.   Maintain a healthy weight Body mass index (BMI) is a measurement that can be used to identify possible weight problems. It estimates body fat based on height and weight. Your health care provider can help determine your BMI and help you achieve or maintain a healthy weight. Get regular exercise Get regular exercise. This is one of the most important things you can do for your health. Most adults should:  Exercise for at least 150 minutes each week. The exercise should increase your heart rate and make you sweat (moderate-intensity exercise).  Do strengthening exercises at least twice a week. This is in addition to the moderate-intensity exercise.  Spend less time sitting. Even light physical activity can be beneficial. Watch cholesterol and blood lipids Have your blood tested for lipids and cholesterol at 77 years of age, then have this test every 5 years. You may need to have your cholesterol levels checked more often if:  Your lipid or cholesterol levels are high.  You are older than 77 years of age.  You are at high risk for heart disease. What should I know about cancer screening? Many types of cancers can be detected early and may often be prevented. Depending on your health history and family history, you may need to have cancer screening at various ages. This may include screening for:  Colorectal cancer.  Prostate cancer.  Skin cancer.  Lung cancer. What should I know about heart disease, diabetes, and high blood pressure? Blood pressure and heart disease  High blood pressure causes heart disease and increases the risk of stroke. This is more likely to develop in people who have high blood pressure readings, are of African descent, or are overweight.  Talk with  your health care provider about your target blood pressure readings.  Have your blood pressure checked: ? Every 3-5 years if you are 27-48 years of age. ? Every year if you are 54 years old or older.  If you are between the ages of 69 and 64 and are a current or former smoker, ask your health care provider if you should have a one-time screening for abdominal aortic aneurysm (AAA). Diabetes Have regular diabetes screenings. This checks your fasting blood sugar level. Have the screening done:  Once every three years after age 55 if you are at a normal weight and have a low risk for diabetes.  More often and at a younger age if you are overweight or have a high risk for diabetes. What should I know about preventing infection? Hepatitis B If you have a higher risk for hepatitis B, you should be screened for this virus. Talk with your health care provider to find out if you are at risk for hepatitis B infection. Hepatitis C Blood testing is recommended for:  Everyone born from [birth year] through 1967.  Anyone with known risk factors for hepatitis C. Sexually transmitted infections (  STIs)  You should be screened each year for STIs, including gonorrhea and chlamydia, if: ? You are sexually active and are younger than 77 years of age. ? You are older than 77 years of age and your health care provider tells you that you are at risk for this type of infection. ? Your sexual activity has changed since you were last screened, and you are at increased risk for chlamydia or gonorrhea. Ask your health care provider if you are at risk.  Ask your health care provider about whether you are at high risk for HIV. Your health care provider may recommend a prescription medicine to help prevent HIV infection. If you choose to take medicine to prevent HIV, you should first get tested for HIV. You should then be tested every 3 months for as long as you are taking the medicine. Follow these instructions at  home: Lifestyle  Do not use any products that contain nicotine or tobacco, such as cigarettes, e-cigarettes, and chewing tobacco. If you need help quitting, ask your health care provider.  Do not use street drugs.  Do not share needles.  Ask your health care provider for help if you need support or information about quitting drugs. Alcohol use  Do not drink alcohol if your health care provider tells you not to drink.  If you drink alcohol: ? Limit how much you have to 0-2 drinks a day. ? Be aware of how much alcohol is in your drink. In the [place] one drink equals one 12 oz bottle of beer (355 mL), one 5 oz glass of wine (148 mL), or one 1 oz glass of hard liquor (44 mL). General instructions  Schedule regular health, dental, and eye exams.  Stay current with your vaccines.  Tell your health care provider if: ? You often feel depressed. ? You have ever been abused or do not feel safe at home. Summary  Adopting a healthy lifestyle and getting preventive care are important in promoting health and wellness.  Follow your health care provider's instructions about healthy diet, exercising, and getting tested or screened for diseases.  Follow your health care provider's instructions on monitoring your cholesterol and blood pressure. This information is not intended to replace advice given to you by your health care provider. Make sure you discuss any questions you have with your health care provider. Document Revised: 04/23/2018 Document Reviewed: 04/23/2018 Elsevier Patient Education  2021 [employer].

## 2020-07-25 NOTE — Progress Notes (Signed)
MEDICARE ANNUAL WELLNESS VISIT  07/25/2020  Telephone Visit Disclaimer This Medicare AWV was conducted by telephone due to national recommendations for restrictions regarding the COVID-19 Pandemic (e.g. social distancing).  I verified, using two identifiers, that I am speaking with Aaron Horn or their authorized healthcare agent. I discussed the limitations, risks, security, and privacy concerns of performing an evaluation and management service by telephone and the potential availability of an in-person appointment in the future. The patient expressed understanding and agreed to proceed.  Location of Patient: Home Location of Provider (nurse):  In the office.  Subjective:    Aaron Horn is a 75 y.o. male patient of Metheney, Rene Kocher, MD who had a Medicare Annual Wellness Visit today via telephone. Aaron Horn is Retired and lives with their spouse. he has 3 children. he reports that he is socially active and does interact with friends/family regularly. he is minimally physically active and enjoys watching t.v.  Patient Care Team: Hali Marry, MD as PCP - General (Family Medicine) Donell Sievert, MD as Referring Physician (Ophthalmology)  Advanced Directives 07/25/2020 06/03/2014 08/21/2013  Does Patient Have a Medical Advance Directive? No No Patient does not have advance directive;Patient would like information  Would patient like information on creating a medical advance directive? No - Patient declined No - patient declined information Advance directive packet given    Hospital Utilization Over the Past 12 Months: # of hospitalizations or ER visits: 0 # of surgeries: 1  Review of Systems    Patient reports that his overall health is unchanged compared to last year.  History obtained from chart review and the patient  Patient Reported Readings (BP, Pulse, CBG, Weight, etc) none  Pain Assessment Pain : No/denies pain     Current Medications & Allergies  (verified) Allergies as of 07/25/2020   No Known Allergies     Medication List       Accurate as of July 25, 2020  9:55 AM. If you have any questions, ask your nurse or doctor.        allopurinol 300 MG tablet Commonly known as: ZYLOPRIM TAKE ONE-HALF TABLET BY MOUTH DAILY   aspirin 81 MG EC tablet Take 81 mg by mouth daily.   Colchicine 0.6 MG Caps 2 on Day 1, then one a day PRN gout flare.  Ok to sub generic or tabs if cheaper.   Colchicine 0.6 MG Caps Take 0.6 mg by mouth daily as needed.   fish oil-omega-3 fatty acids 1000 MG capsule Take 4 mg by mouth daily.   glipiZIDE 5 MG 24 hr tablet Commonly known as: GLUCOTROL XL Take 1 tablet (5 mg total) by mouth daily with breakfast.   hydrochlorothiazide 25 MG tablet Commonly known as: HYDRODIURIL TAKE ONE TABLET BY MOUTH ONE TIME DAILY   ketoconazole 2 % shampoo Commonly known as: NIZORAL APPLY 1 APPLICATION TOPICALLY TWICE WEEKLY. USE DAILY FOR 1 WEEK What changed: See the new instructions.   ketoconazole 2 % cream Commonly known as: NIZORAL Apply 1 application topically 2 (two) times daily. X 3 weeks What changed: Another medication with the same name was changed. Make sure you understand how and when to take each.   lisinopril 40 MG tablet Commonly known as: ZESTRIL TAKE ONE TABLET BY MOUTH ONE TIME DAILY   metFORMIN 1000 MG tablet Commonly known as: GLUCOPHAGE TAKE 1 TABLET BY MOUTH EVERY MORNING, 1/2 TABLET AT LUNCH AND 1 TABLET IN THE EVENING   simvastatin 40 MG tablet  Commonly known as: ZOCOR TAKE ONE TABLET BY MOUTH AT BEDTIME   True Metrix Blood Glucose Test test strip Generic drug: glucose blood USE AS DIRECTED TO TEST ONCE DAILY   TRUEplus Lancets 30G Misc USE AS DIRECTED TO TEST BLOOD SUGAR DAILY   VITAMIN D3 PO Take 1 capsule by mouth. Taking once a week       History (reviewed): Past Medical History:  Diagnosis Date  . AV block, 1st degree   . Dyslipidemia   . FH: colonic polyps    . Gout   . Herpes zoster   . HTN (hypertension)    benign essential  . Impaired fasting glucose    Past Surgical History:  Procedure Laterality Date  . CATARACT EXTRACTION Left 01/2019  . CATARACT EXTRACTION Right 04/2019  . COLONOSCOPY  2002   polyps   . tohphi removal foot  2000   Family History  Problem Relation Age of Onset  . Hypertension Mother   . Esophageal cancer Father    Social History   Socioeconomic History  . Marital status: Married    Spouse name: Vickii Chafe  . Number of children: 3  . Years of education: 31  . Highest education level: Some college, no degree  Occupational History    Comment: Retired  Tobacco Use  . Smoking status: Former Research scientist (life sciences)  . Smokeless tobacco: Never Used  . Tobacco comment: quit smoking in 1998 after 20 pack yr hx  Substance and Sexual Activity  . Alcohol use: Yes    Comment: 2 etoh a night   . Drug use: Not Currently  . Sexual activity: Not on file  Other Topics Concern  . Not on file  Social History Narrative   No regular exercise. Likes to watch tv in his free time.    Social Determinants of Health   Financial Resource Strain: Low Risk   . Difficulty of Paying Living Expenses: Not hard at all  Food Insecurity: No Food Insecurity  . Worried About Charity fundraiser in the Last Year: Never true  . Ran Out of Food in the Last Year: Never true  Transportation Needs: No Transportation Needs  . Lack of Transportation (Medical): No  . Lack of Transportation (Non-Medical): No  Physical Activity: Inactive  . Days of Exercise per Week: 0 days  . Minutes of Exercise per Session: 0 min  Stress: No Stress Concern Present  . Feeling of Stress : Not at all  Social Connections: Socially Isolated  . Frequency of Communication with Friends and Family: Twice a week  . Frequency of Social Gatherings with Friends and Family: Never  . Attends Religious Services: Never  . Active Member of Clubs or Organizations: No  . Attends Theatre manager Meetings: Never  . Marital Status: Married    Activities of Daily Living In your present state of health, do you have any difficulty performing the following activities: 07/25/2020  Hearing? Y  Comment slightly  Vision? N  Difficulty concentrating or making decisions? N  Walking or climbing stairs? N  Dressing or bathing? N  Doing errands, shopping? N  Preparing Food and eating ? N  Using the Toilet? N  In the past six months, have you accidently leaked urine? N  Do you have problems with loss of bowel control? N  Managing your Medications? N  Managing your Finances? N  Housekeeping or managing your Housekeeping? N  Some recent data might be hidden    Patient Education/ Literacy How  often do you need to have someone help you when you read instructions, pamphlets, or other written materials from your doctor or pharmacy?: 1 - Never What is the last grade level you completed in school?: 3 years of college  Exercise Current Exercise Habits: The patient does not participate in regular exercise at present, Exercise limited by: None identified  Diet Patient reports consuming 2-3 meals a day and 0 snack(s) a day Patient reports that his primary diet is: Regular Patient reports that she does have regular access to food.   Depression Screen PHQ 2/9 Scores 07/25/2020 06/20/2020 02/10/2019 04/01/2018 08/15/2017 01/10/2017 09/06/2016  PHQ - 2 Score 0 0 0 0 0 0 0  PHQ- 9 Score - - - - 3 - -     Fall Risk Fall Risk  07/25/2020 06/20/2020 02/17/2020 02/10/2019 04/01/2018  Falls in the past year? 0 0 1 0 0  Number falls in past yr: 0 - 1 0 0  Injury with Fall? 0 - 0 0 -  Risk for fall due to : No Fall Risks No Fall Risks - - -  Follow up Falls evaluation completed - - - -     Objective:  Aaron Horn seemed alert and oriented and he participated appropriately during our telephone visit.  Blood Pressure Weight BMI  BP Readings from Last 3 Encounters:  06/20/20 (!) 121/53   02/17/20 (!) 144/70  11/17/19 132/62   Wt Readings from Last 3 Encounters:  06/20/20 251 lb (113.9 kg)  02/17/20 251 lb (113.9 kg)  11/17/19 248 lb (112.5 kg)   BMI Readings from Last 1 Encounters:  06/20/20 40.51 kg/m    *Unable to obtain current vital signs, weight, and BMI due to telephone visit type  Hearing/Vision  . Aaron Horn did not seem to have difficulty with hearing/understanding during the telephone conversation . Reports that he has had a formal eye exam by an eye care professional within the past year . Reports that he has not had a formal hearing evaluation within the past year *Unable to fully assess hearing and vision during telephone visit type  Cognitive Function: 6CIT Screen 07/25/2020  What Year? 0 points  What month? 0 points  What time? 0 points  Count back from 20 0 points  Months in reverse 0 points  Repeat phrase 0 points  Total Score 0   (Normal:0-7, Significant for Dysfunction: >8)  Normal Cognitive Function Screening: Yes   Immunization & Health Maintenance Record Immunization History  Administered Date(s) Administered  . Fluad Quad(high Dose 65+) 02/10/2019, 02/17/2020  . Influenza Split 02/06/2012  . Influenza Whole 03/02/2008, 02/02/2010  . Influenza, High Dose Seasonal PF 01/17/2017  . Influenza,inj,Quad PF,6+ Mos 03/03/2014, 03/07/2015, 02/06/2016, 12/31/2017  . PFIZER(Purple Top)SARS-COV-2 Vaccination 06/27/2019, 07/20/2019, 02/20/2020  . Pneumococcal Conjugate-13 09/02/2014  . Pneumococcal Polysaccharide-23 07/21/2009  . Td 05/14/2005  . Tdap 07/08/2015    Health Maintenance  Topic Date Due  . COVID-19 Vaccine (4 - Booster for Pfizer series) 08/20/2020  . HEMOGLOBIN A1C  12/18/2020  . OPHTHALMOLOGY EXAM  06/14/2021  . FOOT EXAM  06/20/2021  . COLONOSCOPY (Pts 45-15yrs Insurance coverage will need to be confirmed)  10/12/2024  . TETANUS/TDAP  07/07/2025  . INFLUENZA VACCINE  Completed  . Hepatitis C Screening  Completed  . PNA vac  Low Risk Adult  Completed  . HPV VACCINES  Aged Out       Assessment  This is a routine wellness examination for Aaron Horn.  Health  Maintenance: Due or Overdue There are no preventive care reminders to display for this patient.  [facility] does not need a referral for [employer] Assistance: Care Management:   no Social Work:    no Prescription Assistance:  no Nutrition/Diabetes Education:  no   Plan:  Personalized Goals Goals Addressed              This Visit's Progress   .  Patient Stated (pt-stated)        07/25/2020 AWV Goal: Exercise for General Health   Patient will verbalize understanding of the benefits of increased physical activity:  Exercising regularly is important. It will improve your overall fitness, flexibility, and endurance.  Regular exercise also will improve your overall health. It can help you control your weight, reduce stress, and improve your bone density.  Over the next year, patient will increase physical activity as tolerated with a goal of at least 150 minutes of moderate physical activity per week.   You can tell that you are exercising at a moderate intensity if your heart starts beating faster and you start breathing faster but can still hold a conversation.  Moderate-intensity exercise ideas include:  Walking 1 mile (1.6 km) in about 15 minutes  Biking  Hiking  Golfing  Dancing  Water aerobics  Patient will verbalize understanding of everyday activities that increase physical activity by providing examples like the following: ? Yard work, such as: ? Pushing a Conservation officer, nature ? Raking and bagging leaves ? Washing your car ? Pushing a stroller ? Shoveling snow ? Gardening ? Washing windows or floors  Patient will be able to explain general safety guidelines for exercising:   Before you start a new exercise program, talk with your health care provider.  Do not exercise so much that you hurt yourself, feel dizzy,  or get very short of breath.  Wear comfortable clothes and wear shoes with good support.  Drink plenty of water while you exercise to prevent dehydration or heat stroke.  Work out until your breathing and your heartbeat get faster.       Personalized Health Maintenance & Screening Recommendations  Shingrix vaccine  Lung Cancer Screening Recommended: no (Low Dose CT Chest recommended if Age 52-80 years, 30 pack-year currently smoking OR have quit w/in past 15 years) Hepatitis C Screening recommended: no HIV Screening recommended: no  Advanced Directives: Written information was not prepared per patient's request.  Referrals & Orders No orders of the defined types were placed in this encounter.   Follow-up Plan . Follow-up with Hali Marry, MD as planned . Medicare wellness in one year. . Let us know if you decide to take the shingrix vaccine.   I have personally reviewed and noted the following in the patient's chart:   . Medical and social history . Use of alcohol, tobacco or illicit drugs  . Current medications and supplements . Functional ability and status . Nutritional status . Physical activity . Advanced directives . List of other physicians . Hospitalizations, surgeries, and ER visits in previous 12 months . Vitals . Screenings to include cognitive, depression, and falls . Referrals and appointments  In addition, I have reviewed and discussed with Aaron Horn certain preventive protocols, quality metrics, and best practice recommendations. A written personalized care plan for preventive services as well as general preventive health recommendations is available and can be mailed to the patient at his request.      Tinnie Gens, RN  07/25/2020

## 2020-07-26 ENCOUNTER — Encounter: Payer: Self-pay | Admitting: Family Medicine

## 2020-08-08 ENCOUNTER — Other Ambulatory Visit: Payer: Self-pay | Admitting: Family Medicine

## 2020-09-25 ENCOUNTER — Other Ambulatory Visit: Payer: Self-pay | Admitting: Family Medicine

## 2020-09-26 ENCOUNTER — Telehealth: Payer: Self-pay | Admitting: Family Medicine

## 2020-09-26 NOTE — Progress Notes (Signed)
  Chronic Care Management   Note  09/26/2020 Name: Aaron Horn MRN: [id number] DOB: 27-Feb-1946  Aaron Horn is a 75 y.o. year old male who is a primary care patient of Metheney, Rene Kocher, MD. I reached out to [facility] by phone today in response to a referral sent by Aaron Horn's PCP, Hali Marry, MD.   Aaron Horn was given information about Chronic Care Management services today including:  1. CCM service includes personalized support from designated clinical staff supervised by his physician, including individualized plan of care and coordination with other care providers 2. 24/7 contact phone numbers for assistance for urgent and routine care needs. 3. Service will only be billed when office clinical staff spend 20 minutes or more in a month to coordinate care. 4. Only one practitioner may furnish and bill the service in a calendar month. 5. The patient may stop CCM services at any time (effective at the end of the month) by phone call to the office staff.   Patient did not agree to enrollment in care management services and does not wish to consider at this time.  Follow up plan:   Lauretta Grill Upstream Scheduler

## 2020-09-26 NOTE — Chronic Care Management (AMB) (Signed)
  Chronic Care Management   Outreach Note  09/26/2020 Name: Kaylor Simenson MRN: [id number] DOB: 06-Dec-1945  Referred by: Hali Marry, MD Reason for referral : No chief complaint on file.   An unsuccessful telephone outreach was attempted today. The patient was referred to the pharmacist for assistance with care management and care coordination.   Follow Up Plan:   Lauretta Grill Upstream Scheduler

## 2020-10-07 ENCOUNTER — Other Ambulatory Visit: Payer: Self-pay | Admitting: Family Medicine

## 2020-10-18 ENCOUNTER — Encounter: Payer: Self-pay | Admitting: Family Medicine

## 2020-10-18 ENCOUNTER — Ambulatory Visit (INDEPENDENT_AMBULATORY_CARE_PROVIDER_SITE_OTHER): Payer: Medicare Other | Admitting: Family Medicine

## 2020-10-18 ENCOUNTER — Other Ambulatory Visit: Payer: Self-pay

## 2020-10-18 VITALS — BP 139/78 | HR 60 | Ht 70.87 in | Wt 253.0 lb

## 2020-10-18 DIAGNOSIS — R972 Elevated prostate specific antigen [PSA]: Secondary | ICD-10-CM

## 2020-10-18 DIAGNOSIS — R011 Cardiac murmur, unspecified: Secondary | ICD-10-CM | POA: Diagnosis not present

## 2020-10-18 DIAGNOSIS — M1A09X1 Idiopathic chronic gout, multiple sites, with tophus (tophi): Secondary | ICD-10-CM | POA: Diagnosis not present

## 2020-10-18 DIAGNOSIS — E785 Hyperlipidemia, unspecified: Secondary | ICD-10-CM | POA: Diagnosis not present

## 2020-10-18 DIAGNOSIS — I10 Essential (primary) hypertension: Secondary | ICD-10-CM

## 2020-10-18 DIAGNOSIS — E1169 Type 2 diabetes mellitus with other specified complication: Secondary | ICD-10-CM

## 2020-10-18 LAB — POCT GLYCOSYLATED HEMOGLOBIN (HGB A1C): Hemoglobin A1C: 6.3 % — AB (ref 4.0–5.6)

## 2020-10-18 NOTE — Assessment & Plan Note (Signed)
No recent flares or exacerbations.  Due for uric acid level.

## 2020-10-18 NOTE — Assessment & Plan Note (Signed)
Blood pressure is little borderline but is at goal based on his age we will continue to monitor carefully.  Follow-up in 6 months.

## 2020-10-18 NOTE — Assessment & Plan Note (Signed)
Continue to work on healthy diet and regular exercise I really love to see him lose about 20 pounds.

## 2020-10-18 NOTE — Assessment & Plan Note (Signed)
A1C looks phenomenal today!!!  Keep up the good work.

## 2020-10-18 NOTE — Progress Notes (Signed)
Established Patient Office Visit  Subjective:  Patient ID: Aaron Horn, male    DOB: February 10, 1946  Age: 75 y.o. MRN: [id number]  CC:  Chief Complaint  Patient presents with  . Diabetes    HPI [facility] presents for   Hypertension- Pt denies chest pain, SOB, dizziness, or heart palpitations.  Taking meds as directed w/o problems.  Denies medication side effects.    Diabetes - no hypoglycemic events. No wounds or sores that are not healing well. No increased thirst or urination. Checking glucose at home. Taking medications as prescribed without any side effects.  Gout-he is actually been doing really well no recent flares in fact he has not even had a chance to try the new prescription medication yet.  Past Medical History:  Diagnosis Date  . AV block, 1st degree   . Dyslipidemia   . FH: colonic polyps   . Gout   . Herpes zoster   . HTN (hypertension)    benign essential  . Impaired fasting glucose     Past Surgical History:  Procedure Laterality Date  . CATARACT EXTRACTION Left 01/2019  . CATARACT EXTRACTION Right 04/2019  . COLONOSCOPY  2002   polyps   . tohphi removal foot  2000    Family History  Problem Relation Age of Onset  . Hypertension Mother   . Esophageal cancer Father     Social History   Socioeconomic History  . Marital status: Married    Spouse name: Vickii Chafe  . Number of children: 3  . Years of education: 104  . Highest education level: Some college, no degree  Occupational History    Comment: Retired  Tobacco Use  . Smoking status: Former Research scientist (life sciences)  . Smokeless tobacco: Never Used  . Tobacco comment: quit smoking in 1998 after 20 pack yr hx  Substance and Sexual Activity  . Alcohol use: Yes    Comment: 2 etoh a night   . Drug use: Not Currently  . Sexual activity: Not on file  Other Topics Concern  . Not on file  Social History Narrative   No regular exercise. Likes to watch tv in his free time.    Social Determinants of  Health   Financial Resource Strain: Low Risk   . Difficulty of Paying Living Expenses: Not hard at all  Food Insecurity: No Food Insecurity  . Worried About Charity fundraiser in the Last Year: Never true  . Ran Out of Food in the Last Year: Never true  Transportation Needs: No Transportation Needs  . Lack of Transportation (Medical): No  . Lack of Transportation (Non-Medical): No  Physical Activity: Inactive  . Days of Exercise per Week: 0 days  . Minutes of Exercise per Session: 0 min  Stress: No Stress Concern Present  . Feeling of Stress : Not at all  Social Connections: Socially Isolated  . Frequency of Communication with Friends and Family: Twice a week  . Frequency of Social Gatherings with Friends and Family: Never  . Attends Religious Services: Never  . Active Member of Clubs or Organizations: No  . Attends Archivist Meetings: Never  . Marital Status: Married  Human resources officer Violence: Not At Risk  . Fear of Current or Ex-Partner: No  . Emotionally Abused: No  . Physically Abused: No  . Sexually Abused: No    Outpatient Medications Prior to Visit  Medication Sig Dispense Refill  . allopurinol (ZYLOPRIM) 300 MG tablet TAKE ONE-HALF TABLET BY  MOUTH DAILY 45 tablet 0  . Cholecalciferol (VITAMIN D3 PO) Take 1 capsule by mouth. Taking once a week    . Colchicine 0.6 MG CAPS Take 0.6 mg by mouth daily as needed. 30 capsule 0  . fish oil-omega-3 fatty acids 1000 MG capsule Take 4 mg by mouth daily.     Marland Kitchen glipiZIDE (GLUCOTROL XL) 5 MG 24 hr tablet Take 1 tablet (5 mg total) by mouth daily with breakfast. 90 tablet 1  . hydrochlorothiazide (HYDRODIURIL) 25 MG tablet TAKE ONE TABLET BY MOUTH ONE TIME DAILY 90 tablet 1  . ketoconazole (NIZORAL) 2 % shampoo APPLY 1 APPLICATION TOPICALLY TWICE WEEKLY. USE DAILY FOR 1 WEEK (Patient taking differently: Uses it as needed.) 120 mL 2  . lisinopril (ZESTRIL) 40 MG tablet TAKE ONE TABLET BY MOUTH ONE TIME DAILY 90 tablet 1  .  metFORMIN (GLUCOPHAGE) 1000 MG tablet TAKE 1 TABLET BY MOUTH EVERY MORNING, 1/2 TABLET AT LUNCH AND 1 TABLET IN THE EVENING 225 tablet 2  . simvastatin (ZOCOR) 40 MG tablet TAKE ONE TABLET BY MOUTH AT BEDTIME 90 tablet 3  . TRUE METRIX BLOOD GLUCOSE TEST test strip USE AS DIRECTED TO TEST ONCE DAILY  100 each 12  . TRUEplus Lancets 30G MISC USE AS DIRECTED TO TEST BLOOD SUGAR DAILY 100 each prn  . aspirin 81 MG EC tablet Take 81 mg by mouth daily.   (Patient not taking: Reported on 07/25/2020)    . Colchicine 0.6 MG CAPS 2 on Day 1, then one a day PRN gout flare.  Ok to sub generic or tabs if cheaper. 30 capsule 11  . ketoconazole (NIZORAL) 2 % cream Apply 1 application topically 2 (two) times daily. X 3 weeks (Patient not taking: Reported on 07/25/2020) 30 g 1   No facility-administered medications prior to visit.    No Known Allergies  ROS Review of Systems    Objective:    Physical Exam Constitutional:      Appearance: He is well-developed.  HENT:     Head: Normocephalic and atraumatic.  Cardiovascular:     Rate and Rhythm: Normal rate and regular rhythm.     Heart sounds: Normal heart sounds.     Comments: 2/6 SEM Pulmonary:     Effort: Pulmonary effort is normal.     Breath sounds: Normal breath sounds.  Skin:    General: Skin is warm and dry.  Neurological:     Mental Status: He is alert and oriented to person, place, and time.  Psychiatric:        Behavior: Behavior normal.     BP 139/78   Pulse 60   Ht 5' 10.87" (1.8 m)   Wt 253 lb (114.8 kg)   SpO2 97%   BMI 35.42 kg/m  Wt Readings from Last 3 Encounters:  10/18/20 253 lb (114.8 kg)  06/20/20 251 lb (113.9 kg)  02/17/20 251 lb (113.9 kg)     Health Maintenance Due  Topic Date Due  . Pneumococcal Vaccine 48-15 Years old (1 of 4 - PCV13) Never done  . Zoster Vaccines- Shingrix (1 of 2) Never done  . COVID-19 Vaccine (4 - Booster for Pfizer series) 05/22/2020    There are no preventive care reminders to  display for this patient.  No results found for: TSH No results found for: WBC, HGB, HCT, MCV, PLT Lab Results  Component Value Date   NA 139 06/20/2020   K 4.5 06/20/2020   CO2 28 06/20/2020  GLUCOSE 124 (H) 06/20/2020   BUN 23 06/20/2020   CREATININE 0.96 06/20/2020   BILITOT 0.5 06/20/2020   ALKPHOS 50 06/21/2016   AST 46 (H) 06/20/2020   ALT 56 (H) 06/20/2020   PROT 7.2 06/20/2020   ALBUMIN 4.6 06/21/2016   CALCIUM 10.0 06/20/2020   Lab Results  Component Value Date   CHOL 145 06/20/2020   Lab Results  Component Value Date   HDL 34 (L) 06/20/2020   Lab Results  Component Value Date   LDLCALC 77 06/20/2020   Lab Results  Component Value Date   TRIG 246 (H) 06/20/2020   Lab Results  Component Value Date   CHOLHDL 4.3 06/20/2020   Lab Results  Component Value Date   HGBA1C 6.3 (A) 10/18/2020      Assessment & Plan:   Problem List Items Addressed This Visit      Cardiovascular and Mediastinum   ESSENTIAL HYPERTENSION, BENIGN    Blood pressure is little borderline but is at goal based on his age we will continue to monitor carefully.  Follow-up in 6 months.        Endocrine   Type 2 diabetes mellitus with hyperlipidemia (HCC) - Primary    A1C looks phenomenal today!!!  Keep up the good work.       Relevant Orders   POCT glycosylated hemoglobin (Hb A1C) (Completed)     Other   Severe obesity (BMI 35.0-39.9) with comorbidity ([place])    Continue to work on healthy diet and regular exercise I really love to see him lose about 20 pounds.      Heart murmur   Gout    No recent flares or exacerbations.  Due for uric acid level.      Relevant Orders   Uric acid    Other Visit Diagnoses    Elevated PSA       Relevant Orders   PSA      No orders of the defined types were placed in this encounter.   Follow-up: Return in about 4 months (around 02/17/2021) for Diabetes follow-up.     Beatrice Lecher, MD

## 2020-10-19 LAB — PSA: PSA: 3.57 ng/mL (ref <=4.00)

## 2020-10-19 LAB — URIC ACID: Uric Acid, Serum: 7 mg/dL (ref 4.0–8.0)

## 2020-11-01 ENCOUNTER — Other Ambulatory Visit: Payer: Self-pay | Admitting: Family Medicine

## 2020-11-03 ENCOUNTER — Other Ambulatory Visit: Payer: Self-pay | Admitting: Family Medicine

## 2020-12-02 ENCOUNTER — Other Ambulatory Visit: Payer: Self-pay | Admitting: Family Medicine

## 2020-12-09 ENCOUNTER — Other Ambulatory Visit: Payer: Self-pay | Admitting: Family Medicine

## 2020-12-16 ENCOUNTER — Other Ambulatory Visit: Payer: Self-pay

## 2020-12-16 MED ORDER — METFORMIN HCL 1000 MG PO TABS: ORAL_TABLET | ORAL | 0 refills | Status: DC

## 2020-12-16 NOTE — Telephone Encounter (Signed)
[facility] pharmacy called requesting refill on metformin.  RX sent to pharmacy.  Pt has f/u 02/20/2021.  Charyl Bigger, CMA

## 2021-01-07 ENCOUNTER — Other Ambulatory Visit: Payer: Self-pay | Admitting: Family Medicine

## 2021-01-17 ENCOUNTER — Other Ambulatory Visit: Payer: Self-pay | Admitting: Family Medicine

## 2021-01-17 DIAGNOSIS — E785 Hyperlipidemia, unspecified: Secondary | ICD-10-CM

## 2021-01-17 DIAGNOSIS — E1169 Type 2 diabetes mellitus with other specified complication: Secondary | ICD-10-CM

## 2021-02-06 ENCOUNTER — Other Ambulatory Visit: Payer: Self-pay | Admitting: Family Medicine

## 2021-02-20 ENCOUNTER — Ambulatory Visit (INDEPENDENT_AMBULATORY_CARE_PROVIDER_SITE_OTHER): Payer: Medicare Other | Admitting: Family Medicine

## 2021-02-20 ENCOUNTER — Encounter: Payer: Self-pay | Admitting: Family Medicine

## 2021-02-20 VITALS — BP 140/82 | HR 56 | Ht 71.0 in | Wt 255.0 lb

## 2021-02-20 DIAGNOSIS — R011 Cardiac murmur, unspecified: Secondary | ICD-10-CM | POA: Diagnosis not present

## 2021-02-20 DIAGNOSIS — Z23 Encounter for immunization: Secondary | ICD-10-CM | POA: Diagnosis not present

## 2021-02-20 DIAGNOSIS — E1169 Type 2 diabetes mellitus with other specified complication: Secondary | ICD-10-CM

## 2021-02-20 DIAGNOSIS — E669 Obesity, unspecified: Secondary | ICD-10-CM

## 2021-02-20 DIAGNOSIS — E785 Hyperlipidemia, unspecified: Secondary | ICD-10-CM

## 2021-02-20 DIAGNOSIS — I10 Essential (primary) hypertension: Secondary | ICD-10-CM

## 2021-02-20 LAB — POCT GLYCOSYLATED HEMOGLOBIN (HGB A1C): Hemoglobin A1C: 6.6 % — AB (ref 4.0–5.6)

## 2021-02-20 MED ORDER — KETOCONAZOLE 2 % EX SHAM: MEDICATED_SHAMPOO | CUTANEOUS | 1 refills | Status: AC

## 2021-02-20 MED ORDER — VALSARTAN-HYDROCHLOROTHIAZIDE 320-25 MG PO TABS: 1.0000 | ORAL_TABLET | Freq: Every day | ORAL | 0 refills | Status: DC

## 2021-02-20 NOTE — Progress Notes (Signed)
Established Patient Office Visit  Subjective:  Patient ID: Aaron Horn, male    DOB: 12/21/1945  Age: 75 y.o. MRN: [id number]  CC:  Chief Complaint  Patient presents with   Diabetes     HPI [facility] presents for   Hypertension- Pt denies chest pain, SOB, dizziness, or heart palpitations.  Taking meds as directed w/o problems.  Denies medication side effects.    Diabetes - no hypoglycemic events. No wounds or sores that are not healing well. No increased thirst or urination. Checking glucose at home. Taking medications as prescribed without any side effects.  He reports that he did have an episode of dizziness that sounds like it lasted most of the day.  He thought maybe it was a blood sugar issue but says he checked it pretty regularly for several days in a row and it seemed to have been normal.  He does not have a blood pressure cuff anymore so he was not able to check his blood pressure.  His eye exam was performed in February and his foot exam was performed in February as well.  Colonoscopy is up-to-date.  Past Medical History:  Diagnosis Date   AV block, 1st degree    Dyslipidemia    FH: colonic polyps    Gout    Herpes zoster    HTN (hypertension)    benign essential   Impaired fasting glucose     Past Surgical History:  Procedure Laterality Date   CATARACT EXTRACTION Left 01/2019   CATARACT EXTRACTION Right 04/2019   COLONOSCOPY  2002   polyps    tohphi removal foot  2000    Family History  Problem Relation Age of Onset   Hypertension Mother    Esophageal cancer Father     Social History   Socioeconomic History   Marital status: Married    Spouse name: Peggy   Number of children: 3   Years of education: 15   Highest education level: Some college, no degree  Occupational History    Comment: Retired  Tobacco Use   Smoking status: Former   Smokeless tobacco: Never   Tobacco comments:    quit smoking in 1998 after 20 pack yr hx   Substance and Sexual Activity   Alcohol use: Yes    Comment: 2 etoh a night    Drug use: Not Currently   Sexual activity: Not on file  Other Topics Concern   Not on file  Social History Narrative   No regular exercise. Likes to watch tv in his free time.    Social Determinants of Health   Financial Resource Strain: Low Risk    Difficulty of Paying Living Expenses: Not hard at all  Food Insecurity: No Food Insecurity   Worried About Charity fundraiser in the Last Year: Never true   [place] in the Last Year: Never true  Transportation Needs: No Transportation Needs   Lack of Transportation (Medical): No   Lack of Transportation (Non-Medical): No  Physical Activity: Inactive   Days of Exercise per Week: 0 days   Minutes of Exercise per Session: 0 min  Stress: No Stress Concern Present   Feeling of Stress : Not at all  Social Connections: Socially Isolated   Frequency of Communication with Friends and Family: Twice a week   Frequency of Social Gatherings with Friends and Family: Never   Attends Religious Services: Never   Marine scientist or Organizations: No  Attends Archivist Meetings: Never   Marital Status: Married  Human resources officer Violence: Not At Risk   Fear of Current or Ex-Partner: No   Emotionally Abused: No   Physically Abused: No   Sexually Abused: No    Outpatient Medications Prior to Visit  Medication Sig Dispense Refill   allopurinol (ZYLOPRIM) 300 MG tablet TAKE ONE-HALF TABLET BY MOUTH DAILY 45 tablet 3   Cholecalciferol (VITAMIN D3 PO) Take 1 capsule by mouth. Taking once a week     Colchicine 0.6 MG CAPS Take 0.6 mg by mouth daily as needed. 30 capsule 0   fish oil-omega-3 fatty acids 1000 MG capsule Take 4 mg by mouth daily.      glipiZIDE (GLUCOTROL XL) 5 MG 24 hr tablet TAKE ONE TABLET BY MOUTH ONE TIME DAILY WITH BREAKFAST 90 tablet 0   metFORMIN (GLUCOPHAGE) 1000 MG tablet TAKE 1 TABLET BY MOUTH EVERY MORNING, 1/2 TABLET  AT LUNCH AND 1 TABLET IN THE EVENING 225 tablet 0   simvastatin (ZOCOR) 40 MG tablet TAKE ONE TABLET BY MOUTH AT BEDTIME 90 tablet 3   TRUE METRIX BLOOD GLUCOSE TEST test strip USE AS DIRECTED TO TEST ONCE DAILY 100 each 11   TRUEplus Lancets 30G MISC USE AS DIRECTED TO TEST BLOOD SUGAR DAILY 100 each prn   hydrochlorothiazide (HYDRODIURIL) 25 MG tablet TAKE ONE TABLET BY MOUTH ONE TIME DAILY 90 tablet 0   ketoconazole (NIZORAL) 2 % shampoo Apply topically 2 (two) times a week. 120 mL 0   lisinopril (ZESTRIL) 40 MG tablet TAKE ONE TABLET BY MOUTH ONE TIME DAILY 90 tablet 1   No facility-administered medications prior to visit.    No Known Allergies  ROS Review of Systems    Objective:    Physical Exam Constitutional:      Appearance: Normal appearance. He is well-developed.  HENT:     Head: Normocephalic and atraumatic.  Cardiovascular:     Rate and Rhythm: Normal rate and regular rhythm.     Heart sounds: Murmur heard.     Comments: 2/6 SEM Pulmonary:     Effort: Pulmonary effort is normal.     Breath sounds: Normal breath sounds.  Skin:    General: Skin is warm and dry.  Neurological:     Mental Status: He is alert and oriented to person, place, and time. Mental status is at baseline.  Psychiatric:        Behavior: Behavior normal.   BP 140/82   Pulse (!) 56   Ht 5\' 11"  (1.803 m)   Wt 255 lb (115.7 kg)   SpO2 98%   BMI 35.57 kg/m  Wt Readings from Last 3 Encounters:  02/20/21 255 lb (115.7 kg)  10/18/20 253 lb (114.8 kg)  06/20/20 251 lb (113.9 kg)     Health Maintenance Due  Topic Date Due   Zoster Vaccines- Shingrix (1 of 2) Never done   COVID-19 Vaccine (4 - Booster for Pfizer series) 05/14/2020    There are no preventive care reminders to display for this patient.  No results found for: TSH No results found for: WBC, HGB, HCT, MCV, PLT Lab Results  Component Value Date   NA 139 06/20/2020   K 4.5 06/20/2020   CO2 28 06/20/2020   GLUCOSE 124 (H)  06/20/2020   BUN 23 06/20/2020   CREATININE 0.96 06/20/2020   BILITOT 0.5 06/20/2020   ALKPHOS 50 06/21/2016   AST 46 (H) 06/20/2020   ALT 56 (H)  06/20/2020   PROT 7.2 06/20/2020   ALBUMIN 4.6 06/21/2016   CALCIUM 10.0 06/20/2020   Lab Results  Component Value Date   CHOL 145 06/20/2020   Lab Results  Component Value Date   HDL 34 (L) 06/20/2020   Lab Results  Component Value Date   LDLCALC 77 06/20/2020   Lab Results  Component Value Date   TRIG 246 (H) 06/20/2020   Lab Results  Component Value Date   CHOLHDL 4.3 06/20/2020   Lab Results  Component Value Date   HGBA1C 6.6 (A) 02/20/2021      Assessment & Plan:   Problem List Items Addressed This Visit       Cardiovascular and Mediastinum   ESSENTIAL HYPERTENSION, BENIGN    .BP high today.  Will change to valsartan HCTZ discontinue losartan and HCTZ separately.  No up in 2 weeks for nurse visit to recheck pressure.      Relevant Medications   valsartan-hydrochlorothiazide (DIOVAN-HCT) 320-25 MG tablet     Endocrine   Type 2 diabetes mellitus with hyperlipidemia (HCC) - Primary   Relevant Medications   valsartan-hydrochlorothiazide (DIOVAN-HCT) 320-25 MG tablet   Other Relevant Orders   POCT glycosylated hemoglobin (Hb A1C) (Completed)   Diabetes mellitus type 2 in obese (HCC)    A1c 6.6 today which is up a little bit from previous of 6.3.  We discussed just making sure continue to work on healthy food choices and portion control.  He is also up about 2 pounds since I last saw him just want him to be careful especially as we move into the holiday season.  Plan to follow-up in 4 months.  We did discuss possibly changing the glipizide at some point.  Like to really put him on something that would help him lose weight.  Unfortunately those are gone to be a lot more costly than just the plain glipizide.  Continue metformin      Relevant Medications   valsartan-hydrochlorothiazide (DIOVAN-HCT) 320-25 MG tablet      Other   Severe obesity (BMI 35.0-39.9) with comorbidity ([place])    Continue work on [employer] and staying active.      Heart murmur    the last time he was here that had noted a heart murmur.  He said he had never been told that before I can hear it again today but it is very soft 2 out of 6.  We will go ahead and order echocardiogram for further evaluation.      Relevant Orders   ECHOCARDIOGRAM COMPLETE   Other Visit Diagnoses     Need for immunization against influenza       Relevant Orders   Flu Vaccine QUAD High Dose(Fluad) (Completed)       Flu vac given today.   Meds ordered this encounter  Medications   ketoconazole (NIZORAL) 2 % shampoo    Sig: Apply topically 2 (two) times a week.    Dispense:  120 mL    Refill:  1   valsartan-hydrochlorothiazide (DIOVAN-HCT) 320-25 MG tablet    Sig: Take 1 tablet by mouth daily.    Dispense:  90 tablet    Refill:  0     Follow-up: Return in about 4 months (around 06/23/2021) for Diabetes follow-up, Hypertension.    Beatrice Lecher, MD

## 2021-02-20 NOTE — Patient Instructions (Signed)
Follow up in 2 weeks for bp check with nurse. 

## 2021-02-20 NOTE — Assessment & Plan Note (Addendum)
.  BP high today.  Will change to valsartan HCTZ discontinue losartan and HCTZ separately.  No up in 2 weeks for nurse visit to recheck pressure.

## 2021-02-20 NOTE — Assessment & Plan Note (Signed)
the last time he was here that had noted a heart murmur.  He said he had never been told that before I can hear it again today but it is very soft 2 out of 6.  We will go ahead and order echocardiogram for further evaluation.

## 2021-02-20 NOTE — Assessment & Plan Note (Signed)
Continue work on [employer] and staying active.

## 2021-02-20 NOTE — Assessment & Plan Note (Signed)
A1c 6.6 today which is up a little bit from previous of 6.3.  We discussed just making sure continue to work on healthy food choices and portion control.  He is also up about 2 pounds since I last saw him just want him to be careful especially as we move into the holiday season.  Plan to follow-up in 4 months.  We did discuss possibly changing the glipizide at some point.  Like to really put him on something that would help him lose weight.  Unfortunately those are gone to be a lot more costly than just the plain glipizide.  Continue metformin

## 2021-03-01 ENCOUNTER — Other Ambulatory Visit: Payer: Self-pay

## 2021-03-01 ENCOUNTER — Ambulatory Visit (HOSPITAL_BASED_OUTPATIENT_CLINIC_OR_DEPARTMENT_OTHER)
Admission: RE | Admit: 2021-03-01 | Discharge: 2021-03-01 | Disposition: A | Discharge status: Home / Self Care | Payer: Medicare Other | Source: Ambulatory Visit | Attending: Family Medicine | Admitting: Family Medicine

## 2021-03-01 DIAGNOSIS — R011 Cardiac murmur, unspecified: Secondary | ICD-10-CM | POA: Insufficient documentation

## 2021-03-01 LAB — ECHOCARDIOGRAM COMPLETE
AR max vel: 1.48 cm2
AV Area VTI: 1.48 cm2
AV Area mean vel: 1.5 cm2
AV Mean grad: 11.2 mmHg
AV Peak grad: 19.1 mmHg
Ao pk vel: 2.19 m/s
Area-P 1/2: 3.03 cm2
Calc EF: 53.4 %
S' Lateral: 3.1 cm
Single Plane A2C EF: 55.8 %
Single Plane A4C EF: 50.5 %

## 2021-03-02 ENCOUNTER — Encounter: Payer: Self-pay | Admitting: Family Medicine

## 2021-03-02 DIAGNOSIS — I359 Nonrheumatic aortic valve disorder, unspecified: Secondary | ICD-10-CM | POA: Insufficient documentation

## 2021-03-02 DIAGNOSIS — I5189 Other ill-defined heart diseases: Secondary | ICD-10-CM | POA: Insufficient documentation

## 2021-03-02 NOTE — Progress Notes (Signed)
Hi Desiree, the echocardiogram of your heart shows overall good pumping function of the heart with an EF of 50 to 55%.  It is on the low end of normal but it is functioning overall efficiently.  No abnormalities of wall motion which is great.  There is a little bit of enlargement of the left ventricle this is typically from having high blood pressure.  So is just really important that we control that blood pressure really well to avoid any extra strain on the heart.  There is also some slight abnormal relaxation of the heart in between heart beats.  Nothing that is causing any significant concerns or problems.  But something that we can keep an eye on over time.  The aortic valve has a little bit of calcification and this is causing a little bit of narrowing as the blood flows past that valve.  Again it is just mild nothing concerning and nothing that would be causing any symptoms but something again we can keep an eye on.  I would consider repeating this echo again in about 3 years.

## 2021-03-05 ENCOUNTER — Other Ambulatory Visit: Payer: Self-pay | Admitting: Family Medicine

## 2021-03-06 ENCOUNTER — Other Ambulatory Visit: Payer: Self-pay

## 2021-03-06 ENCOUNTER — Ambulatory Visit (INDEPENDENT_AMBULATORY_CARE_PROVIDER_SITE_OTHER): Payer: Medicare Other | Admitting: Family Medicine

## 2021-03-06 VITALS — BP 108/67 | HR 84

## 2021-03-06 DIAGNOSIS — I10 Essential (primary) hypertension: Secondary | ICD-10-CM

## 2021-03-06 NOTE — Progress Notes (Signed)
HTN -pressure looks fantastic we had switched him from losartan HCT to valsartan HCT and has had a significant drop in blood pressure.  In fact it is a little bit on the lower end.  He is feeling good on his new regimen so for now I think were just going to follow him out.  If at any point he starts feeling lightheaded or dizzy he can let us know and we can always decrease the valsartan or HCTZ in component of the medication.  Beatrice Lecher, MD

## 2021-03-06 NOTE — Progress Notes (Signed)
Pt here for nurse BP check.  BP documented and advised pt that we will call if Dr. Madilyn Fireman has any questions, concerns, or instructions.  Pt expressed understanding.  Charyl Bigger, CMA

## 2021-03-08 ENCOUNTER — Other Ambulatory Visit: Payer: Self-pay | Admitting: Family Medicine

## 2021-03-09 ENCOUNTER — Other Ambulatory Visit: Payer: Self-pay | Admitting: Family Medicine

## 2021-03-10 ENCOUNTER — Other Ambulatory Visit: Payer: Self-pay | Admitting: Family Medicine

## 2021-04-05 ENCOUNTER — Other Ambulatory Visit: Payer: Self-pay | Admitting: Family Medicine

## 2021-05-06 ENCOUNTER — Other Ambulatory Visit: Payer: Self-pay | Admitting: Family Medicine

## 2021-05-06 DIAGNOSIS — E785 Hyperlipidemia, unspecified: Secondary | ICD-10-CM

## 2021-05-23 ENCOUNTER — Other Ambulatory Visit: Payer: Self-pay | Admitting: Family Medicine

## 2021-06-11 ENCOUNTER — Other Ambulatory Visit: Payer: Self-pay | Admitting: Family Medicine

## 2021-06-20 DIAGNOSIS — H35372 Puckering of macula, left eye: Secondary | ICD-10-CM | POA: Diagnosis not present

## 2021-06-20 DIAGNOSIS — E113213 Type 2 diabetes mellitus with mild nonproliferative diabetic retinopathy with macular edema, bilateral: Secondary | ICD-10-CM | POA: Diagnosis not present

## 2021-06-20 DIAGNOSIS — H26493 Other secondary cataract, bilateral: Secondary | ICD-10-CM | POA: Diagnosis not present

## 2021-06-20 DIAGNOSIS — Z961 Presence of intraocular lens: Secondary | ICD-10-CM | POA: Diagnosis not present

## 2021-06-20 LAB — HM DIABETES EYE EXAM

## 2021-06-26 ENCOUNTER — Encounter: Payer: Self-pay | Admitting: Family Medicine

## 2021-06-26 ENCOUNTER — Ambulatory Visit (INDEPENDENT_AMBULATORY_CARE_PROVIDER_SITE_OTHER): Payer: Medicare Other | Admitting: Family Medicine

## 2021-06-26 ENCOUNTER — Other Ambulatory Visit: Payer: Self-pay

## 2021-06-26 VITALS — BP 135/64 | HR 76 | Resp 16 | Ht 71.0 in | Wt 256.0 lb

## 2021-06-26 DIAGNOSIS — E1169 Type 2 diabetes mellitus with other specified complication: Secondary | ICD-10-CM

## 2021-06-26 DIAGNOSIS — E785 Hyperlipidemia, unspecified: Secondary | ICD-10-CM

## 2021-06-26 DIAGNOSIS — Z0000 Encounter for general adult medical examination without abnormal findings: Secondary | ICD-10-CM | POA: Diagnosis not present

## 2021-06-26 DIAGNOSIS — R635 Abnormal weight gain: Secondary | ICD-10-CM

## 2021-06-26 DIAGNOSIS — I10 Essential (primary) hypertension: Secondary | ICD-10-CM | POA: Diagnosis not present

## 2021-06-26 DIAGNOSIS — E669 Obesity, unspecified: Secondary | ICD-10-CM

## 2021-06-26 LAB — POCT GLYCOSYLATED HEMOGLOBIN (HGB A1C): Hemoglobin A1C: 6.9 % — AB (ref 4.0–5.6)

## 2021-06-26 NOTE — Assessment & Plan Note (Signed)
Just encouraged him to continue to work on increasing activity level.

## 2021-06-26 NOTE — Progress Notes (Signed)
Established Patient Office Visit  Subjective:  Patient ID: Aaron Horn, male    DOB: 11-Jul-1945  Age: 76 y.o. MRN: [id number]  CC:  Chief Complaint  Patient presents with   Diabetes    Follow up    Hypertension    Follow up     HPI [facility] presents for   Hypertension- Pt denies chest pain, SOB, dizziness, or heart palpitations.  Taking meds as directed w/o problems.  Denies medication side effects.    Diabetes - no hypoglycemic events. No wounds or sores that are not healing well. No increased thirst or urination. Checking glucose at home. Taking medications as prescribed without any side effects.  He is little frustrated because he says he is actually cut back on eating and actually has gained weight.  Past Medical History:  Diagnosis Date   AV block, 1st degree    Dyslipidemia    FH: colonic polyps    Gout    Herpes zoster    HTN (hypertension)    benign essential   Impaired fasting glucose     Past Surgical History:  Procedure Laterality Date   CATARACT EXTRACTION Left 01/2019   CATARACT EXTRACTION Right 04/2019   COLONOSCOPY  2002   polyps    tohphi removal foot  2000    Family History  Problem Relation Age of Onset   Hypertension Mother    Esophageal cancer Father     Social History   Socioeconomic History   Marital status: Married    Spouse name: Peggy   Number of children: 3   Years of education: 15   Highest education level: Some college, no degree  Occupational History    Comment: Retired  Tobacco Use   Smoking status: Former   Smokeless tobacco: Never   Tobacco comments:    quit smoking in 1998 after 20 pack yr hx  Substance and Sexual Activity   Alcohol use: Yes    Comment: 2 etoh a night    Drug use: Not Currently   Sexual activity: Not on file  Other Topics Concern   Not on file  Social History Narrative   No regular exercise. Likes to watch tv in his free time.    Social Determinants of Health   Financial  Resource Strain: Low Risk    Difficulty of Paying Living Expenses: Not hard at all  Food Insecurity: No Food Insecurity   Worried About Charity fundraiser in the Last Year: Never true   [place] in the Last Year: Never true  Transportation Needs: No Transportation Needs   Lack of Transportation (Medical): No   Lack of Transportation (Non-Medical): No  Physical Activity: Inactive   Days of Exercise per Week: 0 days   Minutes of Exercise per Session: 0 min  Stress: No Stress Concern Present   Feeling of Stress : Not at all  Social Connections: Socially Isolated   Frequency of Communication with Friends and Family: Twice a week   Frequency of Social Gatherings with Friends and Family: Never   Attends Religious Services: Never   Marine scientist or Organizations: No   Attends Music therapist: Never   Marital Status: Married  Human resources officer Violence: Not At Risk   Fear of Current or Ex-Partner: No   Emotionally Abused: No   Physically Abused: No   Sexually Abused: No    Outpatient Medications Prior to Visit  Medication Sig Dispense Refill  allopurinol (ZYLOPRIM) 300 MG tablet TAKE ONE-HALF TABLET BY MOUTH DAILY 45 tablet 3   fish oil-omega-3 fatty acids 1000 MG capsule Take 4 mg by mouth daily.      glipiZIDE (GLUCOTROL XL) 5 MG 24 hr tablet TAKE ONE TABLET BY MOUTH ONE TIME DAILY WITH BREAKFAST 90 tablet 0   ketoconazole (NIZORAL) 2 % shampoo Apply topically 2 (two) times a week. 120 mL 1   metFORMIN (GLUCOPHAGE) 1000 MG tablet TAKE ONE TABLET BY MOUTH EVERY MORNING, ONE-HALF TABLET AT LUNCH AND ONE TABLET IN THE EVENING 225 tablet 0   simvastatin (ZOCOR) 40 MG tablet TAKE ONE TABLET BY MOUTH AT BEDTIME 90 tablet 0   TRUE METRIX BLOOD GLUCOSE TEST test strip USE AS DIRECTED TO TEST ONCE DAILY 100 each 11   TRUEplus Lancets 30G MISC USE AS DIRECTED TO TEST BLOOD SUGAR DAILY 100 each prn   valsartan-hydrochlorothiazide (DIOVAN-HCT) 320-25 MG tablet TAKE  ONE TABLET BY MOUTH ONE TIME DAILY 90 tablet 0   Cholecalciferol (VITAMIN D3 PO) Take 1 capsule by mouth. Taking once a week     Colchicine 0.6 MG CAPS Take 0.6 mg by mouth daily as needed. 30 capsule 0   No facility-administered medications prior to visit.    No Known Allergies  ROS Review of Systems    Objective:    Physical Exam Constitutional:      Appearance: Normal appearance. He is well-developed.  HENT:     Head: Normocephalic and atraumatic.  Cardiovascular:     Rate and Rhythm: Normal rate and regular rhythm.     Heart sounds: Murmur heard.     Comments: 2+ heart murmur.  Pulmonary:     Effort: Pulmonary effort is normal.     Breath sounds: Normal breath sounds.  Skin:    General: Skin is warm and dry.  Neurological:     Mental Status: He is alert and oriented to person, place, and time. Mental status is at baseline.  Psychiatric:        Behavior: Behavior normal.    BP 135/64 (BP Location: Left Arm)    Pulse 76    Resp 16    Ht 5\' 11"  (1.803 m)    Wt 256 lb (116.1 kg)    SpO2 96%    BMI 35.70 kg/m  Wt Readings from Last 3 Encounters:  06/26/21 256 lb (116.1 kg)  02/20/21 255 lb (115.7 kg)  10/18/20 253 lb (114.8 kg)     Health Maintenance Due  Topic Date Due   Zoster Vaccines- Shingrix (1 of 2) Never done   COVID-19 Vaccine (4 - Booster for Pfizer series) 04/16/2020   OPHTHALMOLOGY EXAM  06/14/2021    There are no preventive care reminders to display for this patient.  No results found for: TSH No results found for: WBC, HGB, HCT, MCV, PLT Lab Results  Component Value Date   NA 139 06/20/2020   K 4.5 06/20/2020   CO2 28 06/20/2020   GLUCOSE 124 (H) 06/20/2020   BUN 23 06/20/2020   CREATININE 0.96 06/20/2020   BILITOT 0.5 06/20/2020   ALKPHOS 50 06/21/2016   AST 46 (H) 06/20/2020   ALT 56 (H) 06/20/2020   PROT 7.2 06/20/2020   ALBUMIN 4.6 06/21/2016   CALCIUM 10.0 06/20/2020   Lab Results  Component Value Date   CHOL 145 06/20/2020    Lab Results  Component Value Date   HDL 34 (L) 06/20/2020   Lab Results  Component Value Date  LDLCALC 77 06/20/2020   Lab Results  Component Value Date   TRIG 246 (H) 06/20/2020   Lab Results  Component Value Date   CHOLHDL 4.3 06/20/2020   Lab Results  Component Value Date   HGBA1C 6.9 (A) 06/26/2021      Assessment & Plan:   Problem List Items Addressed This Visit       Cardiovascular and Mediastinum   ESSENTIAL HYPERTENSION, BENIGN    Well controlled. Continue current regimen. Follow up in  6 mo       Relevant Orders   Lipid Panel w/reflex Direct LDL   COMPLETE METABOLIC PANEL WITH GFR   CBC   PSA     Endocrine   Type 2 diabetes mellitus with hyperlipidemia (HCC) - Primary    A1c 6.9 today up a little bit from previous at 6.6.  He says he is actually cut back to 2 meals per day and has been trying to lose a little bit of weight but in fact he is actually gained weight.  We will plan to check his thyroid today.  Due for updated blood work.  Continue to work on portion control and increased activity.      Relevant Orders   Lipid Panel w/reflex Direct LDL   COMPLETE METABOLIC PANEL WITH GFR   CBC   PSA   POCT HgB A1C (Completed)   Diabetes mellitus type 2 in obese [facility])    See note above.         Other   Severe obesity (BMI 35.0-39.9) with comorbidity ([place])    Just encouraged him to continue to work on increasing activity level.      Other Visit Diagnoses     Abnormal weight gain           Discussed shingles vaccine and he plans on getting it at the pharmacy now that the prices come down when he had originally had tried to get it it was going to cost him over $200.  Abnormal weight Gain-we will check thyroid level.  No orders of the defined types were placed in this encounter.   Follow-up: Return in about 3 months (around 09/23/2021) for Diabetes follow-up.    Beatrice Lecher, MD

## 2021-06-26 NOTE — Assessment & Plan Note (Signed)
See note above

## 2021-06-26 NOTE — Assessment & Plan Note (Addendum)
A1c 6.9 today up a little bit from previous at 6.6.  He says he is actually cut back to 2 meals per day and has been trying to lose a little bit of weight but in fact he is actually gained weight.  We will plan to check his thyroid today.  Due for updated blood work.  Continue to work on portion control and increased activity.

## 2021-06-26 NOTE — Assessment & Plan Note (Signed)
Well controlled. Continue current regimen. Follow up in  6 mo  

## 2021-06-27 LAB — COMPLETE METABOLIC PANEL WITH GFR
AG Ratio: 1.8 (calc) (ref 1.0–2.5)
ALT: 43 U/L (ref 9–46)
AST: 30 U/L (ref 10–35)
Albumin: 4.4 g/dL (ref 3.6–5.1)
Alkaline phosphatase (APISO): 78 U/L (ref 35–144)
BUN: 16 mg/dL (ref 7–25)
CO2: 28 mmol/L (ref 20–32)
Calcium: 10.1 mg/dL (ref 8.6–10.3)
Chloride: 102 mmol/L (ref 98–110)
Creat: 0.95 mg/dL (ref 0.70–1.28)
Globulin: 2.4 g/dL (calc) (ref 1.9–3.7)
Glucose, Bld: 111 mg/dL — ABNORMAL HIGH (ref 65–99)
Potassium: 5.3 mmol/L (ref 3.5–5.3)
Sodium: 141 mmol/L (ref 135–146)
Total Bilirubin: 0.5 mg/dL (ref 0.2–1.2)
Total Protein: 6.8 g/dL (ref 6.1–8.1)
eGFR: 82 mL/min/{1.73_m2} (ref >=60)

## 2021-06-27 LAB — LIPID PANEL W/REFLEX DIRECT LDL
Cholesterol: 157 mg/dL (ref <200)
HDL: 41 mg/dL (ref >=40)
LDL Cholesterol (Calc): 79 mg/dL (calc)
Non-HDL Cholesterol (Calc): 116 mg/dL (calc) (ref <130)
Total CHOL/HDL Ratio: 3.8 (calc) (ref <5.0)
Triglycerides: 285 mg/dL — ABNORMAL HIGH (ref <150)

## 2021-06-27 LAB — CBC
HCT: 43.9 % (ref 38.5–50.0)
Hemoglobin: 14.7 g/dL (ref 13.2–17.1)
MCH: 31.9 pg (ref 27.0–33.0)
MCHC: 33.5 g/dL (ref 32.0–36.0)
MCV: 95.2 fL (ref 80.0–100.0)
MPV: 10.7 fL (ref 7.5–12.5)
Platelets: 186 10*3/uL (ref 140–400)
RBC: 4.61 10*6/uL (ref 4.20–5.80)
RDW: 13.4 % (ref 11.0–15.0)
WBC: 10 10*3/uL (ref 3.8–10.8)

## 2021-06-27 LAB — PSA: PSA: 6.51 ng/mL — ABNORMAL HIGH (ref <=4.00)

## 2021-06-27 NOTE — Progress Notes (Signed)
Aaron Horn,  total cholesterol and LDL look great.  Triglycerides are still up a little bit just encourage you to continue to work on trying to increase regular activity do a little walking stretching range of motion exercises etc.  Your liver enzymes look great this time.  Your prostate blood test jumped up.  8 months ago it was 3.5 and now at 6.5.  I would like to refer you to urology.  I cannot remember if there is someone that you have seen in the past that you would like to get back in with.  Your blood count is normal.

## 2021-07-04 ENCOUNTER — Encounter: Payer: Self-pay | Admitting: Family Medicine

## 2021-07-04 DIAGNOSIS — E785 Hyperlipidemia, unspecified: Secondary | ICD-10-CM

## 2021-07-04 DIAGNOSIS — E1169 Type 2 diabetes mellitus with other specified complication: Secondary | ICD-10-CM

## 2021-07-05 MED ORDER — GLIPIZIDE ER 10 MG PO TB24: 10.0000 mg | ORAL_TABLET | Freq: Every day | ORAL | 3 refills | Status: DC

## 2021-07-05 NOTE — Telephone Encounter (Signed)
Sorry.   Meds ordered this encounter  Medications   glipiZIDE (GLUCOTROL XL) 10 MG 24 hr tablet    Sig: Take 1 tablet (10 mg total) by mouth daily with breakfast.    Dispense:  90 tablet    Refill:  3

## 2021-08-04 ENCOUNTER — Other Ambulatory Visit: Payer: Self-pay | Admitting: Family Medicine

## 2021-08-04 DIAGNOSIS — E785 Hyperlipidemia, unspecified: Secondary | ICD-10-CM

## 2021-08-13 ENCOUNTER — Other Ambulatory Visit: Payer: Self-pay | Admitting: Family Medicine

## 2021-08-17 DIAGNOSIS — H26491 Other secondary cataract, right eye: Secondary | ICD-10-CM | POA: Diagnosis not present

## 2021-09-08 ENCOUNTER — Other Ambulatory Visit: Payer: Self-pay | Admitting: Family Medicine

## 2021-09-10 ENCOUNTER — Other Ambulatory Visit: Payer: Self-pay | Admitting: Family Medicine

## 2021-09-25 ENCOUNTER — Encounter: Payer: Self-pay | Admitting: Family Medicine

## 2021-09-25 ENCOUNTER — Ambulatory Visit (INDEPENDENT_AMBULATORY_CARE_PROVIDER_SITE_OTHER): Payer: Medicare Other | Admitting: Family Medicine

## 2021-09-25 VITALS — BP 165/62 | HR 61 | Ht 71.0 in | Wt 255.0 lb

## 2021-09-25 DIAGNOSIS — E785 Hyperlipidemia, unspecified: Secondary | ICD-10-CM

## 2021-09-25 DIAGNOSIS — M1A09X1 Idiopathic chronic gout, multiple sites, with tophus (tophi): Secondary | ICD-10-CM

## 2021-09-25 DIAGNOSIS — E1169 Type 2 diabetes mellitus with other specified complication: Secondary | ICD-10-CM | POA: Diagnosis not present

## 2021-09-25 DIAGNOSIS — I10 Essential (primary) hypertension: Secondary | ICD-10-CM

## 2021-09-25 LAB — POCT UA - MICROALBUMIN
Albumin/Creatinine Ratio, Urine, POC: <30
Creatinine, POC: 200 mg/dL
Microalbumin Ur, POC: 30 mg/L

## 2021-09-25 LAB — POCT GLYCOSYLATED HEMOGLOBIN (HGB A1C): Hemoglobin A1C: 6.3 % — AB (ref 4.0–5.6)

## 2021-09-25 MED ORDER — OLMESARTAN-AMLODIPINE-HCTZ 40-5-25 MG PO TABS: 1.0000 | ORAL_TABLET | Freq: Every day | ORAL | 1 refills | Status: DC

## 2021-09-25 NOTE — Assessment & Plan Note (Signed)
A1c looks much better today at 6.3 which is fantastic.  He is doing really well.  He says the only real changes that has been more consistent with taking the metformin midday.  Just encouraged him to continue to work on healthy food choices and regular exercise.  Plan to follow-up in 3 to 4 months. ?

## 2021-09-25 NOTE — Assessment & Plan Note (Signed)
Not well controlled today. Consider trial of Tribenzor.  ?

## 2021-09-25 NOTE — Patient Instructions (Signed)
I am changing your losartan to Tribenzor. New script sent.   ?

## 2021-09-25 NOTE — Progress Notes (Signed)
? ?Established Patient Office Visit ? ?Subjective   ?Patient ID: Aaron Horn, male    DOB: 01/28/1946  Age: 76 y.o. MRN: [id number] ? ?Chief Complaint  ?Patient presents with  ? Diabetes  ? Hypertension  ? ? ?HPI ?Diabetes - no hypoglycemic events. No wounds or sores that are not healing well. No increased thirst or urination. Checking glucose at home. Taking medications as prescribed without any side effects.  He says that when he for started the valsartan HCTZ he felt like his blood pressure went down in fact he came back in for follow-up visit and it was almost borderline low.  But he says after about a week his blood pressure started to shoot back up and they have been high ever since.  Normally he does watch his salt intake though yesterday he took his wife out to dinner so he feels like he is a little bit more salt overloaded today.  He reports that he is been more consistent with taking his midday dose of metformin since I last saw him. ? ?Hypertension- Pt denies chest pain, SOB, dizziness, or heart palpitations.  Taking meds as directed w/o problems.  Denies medication side effects.   ? ? ? ?ROS ? ?  ?Objective:  ?  ? ?BP (!) 165/62   Pulse 61   Ht '5\' 11"'$  (1.803 m)   Wt 255 lb (115.7 kg)   SpO2 97%   BMI 35.57 kg/m?  ? ? ?Physical Exam ?Constitutional:   ?   Appearance: He is well-developed.  ?HENT:  ?   Head: Normocephalic and atraumatic.  ?Cardiovascular:  ?   Rate and Rhythm: Normal rate and regular rhythm.  ?   Heart sounds: Normal heart sounds.  ?Pulmonary:  ?   Effort: Pulmonary effort is normal.  ?   Breath sounds: Normal breath sounds.  ?Skin: ?   General: Skin is warm and dry.  ?Neurological:  ?   Mental Status: He is alert and oriented to person, place, and time.  ?Psychiatric:     ?   Behavior: Behavior normal.  ? ? ? ?Results for orders placed or performed in visit on 09/25/21  ?POCT glycosylated hemoglobin (Hb A1C)  ?Result Value Ref Range  ? Hemoglobin A1C 6.3 (A) 4.0 - 5.6 %  ?  HbA1c POC (<> result, manual entry)    ? HbA1c, POC (prediabetic range)    ? HbA1c, POC (controlled diabetic range)    ?POCT UA - Microalbumin  ?Result Value Ref Range  ? Microalbumin Ur, POC 30 mg/L  ? Creatinine, POC 200 mg/dL  ? Albumin/Creatinine Ratio, Urine, POC <30   ? ? ? ? ?The 10-year ASCVD risk score (Arnett DK, et al., 2019) is: 62.4% ? ?  ?Assessment & Plan:  ? ?Problem List Items Addressed This Visit   ? ?  ? Cardiovascular and Mediastinum  ? ESSENTIAL HYPERTENSION, BENIGN  ?  Not well controlled today. Consider trial of Tribenzor.  ? ?  ?  ? Relevant Medications  ? Olmesartan-amLODIPine-HCTZ 40-5-25 MG TABS  ? Other Relevant Orders  ? POCT UA - Microalbumin (Completed)  ?  ? Endocrine  ? Type 2 diabetes mellitus with hyperlipidemia ([place]) - Primary  ?  A1c looks much better today at 6.3 which is fantastic.  He is doing really well.  He says the only real changes that has been more consistent with taking the metformin midday.  Just encouraged him to continue to work on healthy food choices and  regular exercise.  Plan to follow-up in 3 to 4 months. ? ?  ?  ? Relevant Medications  ? Olmesartan-amLODIPine-HCTZ 40-5-25 MG TABS  ? Other Relevant Orders  ? POCT glycosylated hemoglobin (Hb A1C) (Completed)  ? POCT UA - Microalbumin (Completed)  ?  ? Other  ? Gout  ?  Due to recheck uric acid.   ? ?  ?  ? Relevant Orders  ? Uric acid  ? ? ?Return in about 15 weeks (around 01/08/2022).  ? ? ?Beatrice Lecher, MD ? ?

## 2021-09-25 NOTE — Assessment & Plan Note (Signed)
Due to recheck uric acid.   ?

## 2021-09-26 ENCOUNTER — Encounter: Payer: Self-pay | Admitting: Family Medicine

## 2021-09-26 DIAGNOSIS — M1A09X1 Idiopathic chronic gout, multiple sites, with tophus (tophi): Secondary | ICD-10-CM

## 2021-09-26 LAB — URIC ACID: Uric Acid, Serum: 7 mg/dL (ref 4.0–8.0)

## 2021-09-26 MED ORDER — ALLOPURINOL 300 MG PO TABS: 300.0000 mg | ORAL_TABLET | Freq: Every day | ORAL | 3 refills | Status: DC

## 2021-09-26 NOTE — Progress Notes (Signed)
Hi Aaron Horn, your uric acid level still little too high.  Are you still taking a half a tab daily or a whole tab?  It looks like we need to increase your allopurinol dose.

## 2021-10-05 ENCOUNTER — Encounter: Payer: Self-pay | Admitting: Family Medicine

## 2021-10-05 NOTE — Telephone Encounter (Signed)
Yes just uric acid. He was up to date from [facility] on other labs. Maybe I forgot something?  Was there something in particular he was expecting?

## 2021-10-10 ENCOUNTER — Ambulatory Visit (INDEPENDENT_AMBULATORY_CARE_PROVIDER_SITE_OTHER): Payer: Medicare Other | Admitting: Family Medicine

## 2021-10-10 VITALS — BP 119/57 | HR 79

## 2021-10-10 DIAGNOSIS — I10 Essential (primary) hypertension: Secondary | ICD-10-CM

## 2021-10-10 NOTE — Progress Notes (Signed)
Patient comes in today for blood pressure check.   Aaron Horn is taking Olmesartan-Amlodipine-HCTZ 40-5-25 mg daily for blood pressure control. He denies any missed doses, side effects, headaches, chest pain, palpitations, dizziness, or shortness of breath.   Aaron Horn has been checking blood pressure readings at home. He did not bring in his log with him today. He states the average is 140's over 60's.   His blood pressure reading today is: 119/57.  I spoke with Dr. Madilyn Fireman who advised blood pressure number is great today. She suggested he come back with blood pressure machine to check his machine against ours. He declined and would rather go home and check blood pressure now to see how this compares. No changes for now.

## 2021-10-10 NOTE — Progress Notes (Signed)
Agree with documentation as above.   Hasel Janish, MD  

## 2021-11-03 ENCOUNTER — Other Ambulatory Visit: Payer: Self-pay | Admitting: Family Medicine

## 2021-11-03 DIAGNOSIS — E785 Hyperlipidemia, unspecified: Secondary | ICD-10-CM

## 2021-12-04 ENCOUNTER — Other Ambulatory Visit: Payer: Self-pay | Admitting: Family Medicine

## 2021-12-14 ENCOUNTER — Other Ambulatory Visit: Payer: Self-pay | Admitting: Family Medicine

## 2021-12-26 ENCOUNTER — Ambulatory Visit (INDEPENDENT_AMBULATORY_CARE_PROVIDER_SITE_OTHER): Payer: Medicare Other | Admitting: Family Medicine

## 2021-12-26 VITALS — BP 138/66 | HR 60

## 2021-12-26 DIAGNOSIS — M1A09X1 Idiopathic chronic gout, multiple sites, with tophus (tophi): Secondary | ICD-10-CM

## 2021-12-26 DIAGNOSIS — E1169 Type 2 diabetes mellitus with other specified complication: Secondary | ICD-10-CM | POA: Diagnosis not present

## 2021-12-26 DIAGNOSIS — E785 Hyperlipidemia, unspecified: Secondary | ICD-10-CM | POA: Diagnosis not present

## 2021-12-26 DIAGNOSIS — D692 Other nonthrombocytopenic purpura: Secondary | ICD-10-CM

## 2021-12-26 DIAGNOSIS — I10 Essential (primary) hypertension: Secondary | ICD-10-CM | POA: Diagnosis not present

## 2021-12-26 DIAGNOSIS — E669 Obesity, unspecified: Secondary | ICD-10-CM

## 2021-12-26 LAB — POCT GLYCOSYLATED HEMOGLOBIN (HGB A1C): HbA1c POC (<> result, manual entry): 6.7 % (ref 4.0–5.6)

## 2021-12-26 LAB — POCT UA - MICROALBUMIN
Creatinine, POC: 200 mg/dL
Microalbumin Ur, POC: 80 mg/L

## 2021-12-26 NOTE — Assessment & Plan Note (Signed)
Repeat BP much improved.  We will continue with Tribenzor.  He takes his medication around 11 AM each day.

## 2021-12-26 NOTE — Assessment & Plan Note (Signed)
Stable

## 2021-12-26 NOTE — Progress Notes (Signed)
Established Patient Office Visit  Subjective   Patient ID: Aaron Horn, male    DOB: 07-03-1945  Age: 76 y.o. MRN: [id number]  Chief Complaint  Patient presents with   Follow-up    Htn and dm last a1c 6.3    HPI  Diabetes - no hypoglycemic events. No wounds or sores that are not healing well. No increased thirst or urination. Checking glucose at home. Taking medications as prescribed without any side effects.  Not currently exercising.  Hypertension- Pt denies chest pain, SOB, dizziness, or heart palpitations.  Taking meds as directed w/o problems.  Denies medication side effects.    Gout-last uric acid level was 7 so I had him increase his allopurinol to a whole tab daily.  He thinks he had a slight flare in his small toe on his left foot.  It lasted for several days and then he finally took colchicine and got pretty quick relief.  He never had a gout flare there before.    ROS    Objective:     BP 138/66   Pulse 60    Physical Exam Constitutional:      Appearance: He is well-developed.  HENT:     Head: Normocephalic and atraumatic.  Cardiovascular:     Rate and Rhythm: Normal rate and regular rhythm.     Heart sounds: Normal heart sounds.  Pulmonary:     Effort: Pulmonary effort is normal.     Breath sounds: Normal breath sounds.  Skin:    General: Skin is warm and dry.  Neurological:     Mental Status: He is alert and oriented to person, place, and time.  Psychiatric:        Behavior: Behavior normal.     Results for orders placed or performed in visit on 12/26/21  POCT UA - Microalbumin  Result Value Ref Range   Microalbumin Ur, POC 80 mg/L   Creatinine, POC 200 mg/dL   Albumin/Creatinine Ratio, Urine, POC 30-300   POCT HgB A1C  Result Value Ref Range   Hemoglobin A1C     HbA1c POC (<> result, manual entry) 6.7 4.0 - 5.6 %   HbA1c, POC (prediabetic range)     HbA1c, POC (controlled diabetic range)        The 10-year ASCVD risk score  (Arnett DK, et al., 2019) is: 51%    Assessment & Plan:   Problem List Items Addressed This Visit       Cardiovascular and Mediastinum   Purpura senilis (HCC)    Stable      ESSENTIAL HYPERTENSION, BENIGN - Primary    Repeat BP much improved.  We will continue with Tribenzor.  He takes his medication around 11 AM each day.      Relevant Orders   BASIC METABOLIC PANEL WITH GFR     Endocrine   Type 2 diabetes mellitus with hyperlipidemia (HCC)   Relevant Orders   POCT UA - Microalbumin (Completed)   POCT HgB A1C (Completed)   Diabetes mellitus type 2 in obese (HCC)    1C up slightly from previous but still under 7.  Continue to work on healthy diet and regular exercise he is not currently exercising routinely and encouraged him to consider doing a 10 or 15-minute walk daily.  Follow-up in 3 months.      Relevant Orders   BASIC METABOLIC PANEL WITH GFR     Other   Gout    PLan to recheck A1c now  that he is on a whole tab of allopurinol.  He is on HCTZ which can certainly exacerbate gout flares so we will monitor carefully but right now it is extremely important we get his blood pressure under good control.      Relevant Orders   Uric acid    Return in about 3 months (around 03/28/2022) for Diabetes follow-up.    Beatrice Lecher, MD

## 2021-12-26 NOTE — Assessment & Plan Note (Addendum)
PLan to recheck A1c now that he is on a whole tab of allopurinol.  He is on HCTZ which can certainly exacerbate gout flares so we will monitor carefully but right now it is extremely important we get his blood pressure under good control.

## 2021-12-26 NOTE — Assessment & Plan Note (Signed)
1C up slightly from previous but still under 7.  Continue to work on healthy diet and regular exercise he is not currently exercising routinely and encouraged him to consider doing a 10 or 15-minute walk daily.  Follow-up in 3 months.

## 2021-12-27 LAB — BASIC METABOLIC PANEL WITH GFR
BUN: 16 mg/dL (ref 7–25)
CO2: 29 mmol/L (ref 20–32)
Calcium: 10 mg/dL (ref 8.6–10.3)
Chloride: 99 mmol/L (ref 98–110)
Creat: 0.94 mg/dL (ref 0.70–1.28)
Glucose, Bld: 145 mg/dL — ABNORMAL HIGH (ref 65–99)
Potassium: 4.5 mmol/L (ref 3.5–5.3)
Sodium: 140 mmol/L (ref 135–146)
eGFR: 83 mL/min/{1.73_m2} (ref >=60)

## 2021-12-27 LAB — URIC ACID: Uric Acid, Serum: 6 mg/dL (ref 4.0–8.0)

## 2021-12-27 NOTE — Progress Notes (Signed)
Hi Aaron Horn, your uric acid looks better!! Keep with the whole tab of allopurinol. Metabolic panel is OK.

## 2022-01-03 ENCOUNTER — Encounter: Payer: Self-pay | Admitting: General Practice

## 2022-01-28 ENCOUNTER — Other Ambulatory Visit: Payer: Self-pay | Admitting: Family Medicine

## 2022-01-28 DIAGNOSIS — E785 Hyperlipidemia, unspecified: Secondary | ICD-10-CM

## 2022-02-04 ENCOUNTER — Other Ambulatory Visit: Payer: Self-pay | Admitting: Family Medicine

## 2022-02-13 ENCOUNTER — Other Ambulatory Visit: Payer: Self-pay | Admitting: Family Medicine

## 2022-03-04 ENCOUNTER — Other Ambulatory Visit: Payer: Self-pay | Admitting: Family Medicine

## 2022-03-09 ENCOUNTER — Telehealth: Payer: Self-pay | Admitting: Family Medicine

## 2022-03-09 NOTE — Telephone Encounter (Signed)
Called patient on 03/09/22 regarding AWVS and LVM to schedule, noticed not in demographics section of patient information after call stating patient does not wish to be called. Marked as declined. Aaron Horn

## 2022-03-19 ENCOUNTER — Other Ambulatory Visit: Payer: Self-pay | Admitting: Family Medicine

## 2022-03-19 DIAGNOSIS — I10 Essential (primary) hypertension: Secondary | ICD-10-CM

## 2022-03-19 DIAGNOSIS — E1169 Type 2 diabetes mellitus with other specified complication: Secondary | ICD-10-CM

## 2022-03-21 ENCOUNTER — Other Ambulatory Visit: Payer: Self-pay | Admitting: Family Medicine

## 2022-03-28 ENCOUNTER — Ambulatory Visit (INDEPENDENT_AMBULATORY_CARE_PROVIDER_SITE_OTHER): Payer: Medicare Other | Admitting: Family Medicine

## 2022-03-28 ENCOUNTER — Encounter: Payer: Self-pay | Admitting: Family Medicine

## 2022-03-28 ENCOUNTER — Telehealth: Payer: Self-pay

## 2022-03-28 VITALS — BP 112/54 | HR 61 | Ht 71.0 in | Wt 251.0 lb

## 2022-03-28 DIAGNOSIS — E669 Obesity, unspecified: Secondary | ICD-10-CM

## 2022-03-28 DIAGNOSIS — E785 Hyperlipidemia, unspecified: Secondary | ICD-10-CM | POA: Diagnosis not present

## 2022-03-28 DIAGNOSIS — E1169 Type 2 diabetes mellitus with other specified complication: Secondary | ICD-10-CM

## 2022-03-28 DIAGNOSIS — I10 Essential (primary) hypertension: Secondary | ICD-10-CM | POA: Diagnosis not present

## 2022-03-28 LAB — POCT GLYCOSYLATED HEMOGLOBIN (HGB A1C): Hemoglobin A1C: 7 % — AB (ref 4.0–5.6)

## 2022-03-28 MED ORDER — METFORMIN HCL 1000 MG PO TABS: 1000.0000 mg | ORAL_TABLET | Freq: Three times a day (TID) | ORAL | 3 refills | Status: DC

## 2022-03-28 NOTE — Telephone Encounter (Signed)
Sorry, I did not realize it kept the old [place].  I corrected the prescription and resent it.  Meds ordered this encounter  Medications   metFORMIN (GLUCOPHAGE) 1000 MG tablet    Sig: Take 1 tablet (1,000 mg total) by mouth 3 (three) times daily before meals.    Dispense:  270 tablet    Refill:  3

## 2022-03-28 NOTE — Patient Instructions (Signed)
Please schedule your Medicare wellness exam on the way out.  We will get you scheduled with Novella Rob our wellness nurse.  You can do it in person or virtually.

## 2022-03-28 NOTE — Telephone Encounter (Signed)
[facility] pharmacy called and left voice mail wanting to clarify directions for metformin sent over today .  States has two directions.  First is take one tablet by mouth daily before meals and then also  take one tablet in a.m. , 1/2 tablet at lunch, and 1 tablet every evening.  Please advise. Thank you,:)

## 2022-03-28 NOTE — Progress Notes (Signed)
Established Patient Office Visit  Subjective   Patient ID: Aaron Horn, male    DOB: 1945/07/24  Age: 76 y.o. MRN: [id number]  Chief Complaint  Patient presents with   Diabetes    HPI  Diabetes - no hypoglycemic events. No wounds or sores that are not healing well. No increased thirst or urination. Checking glucose at home. Taking medications as prescribed without any side effects.  Reports home blood sugars have been running in the 1 35-1 45 fasting.  He says he really only now eats 2 meals a day but does try to get incorporate vegetables regularly which she really was not doing previously.  Hypertension- Pt denies chest pain, SOB, dizziness, or heart palpitations.  Taking meds as directed w/o problems.  Denies medication side effects.  Reports home blood pressures have been running in the 140s to 150s.      ROS    Objective:     BP (!) 112/54   Pulse 61   Ht '5\' 11"'$  (1.803 m)   Wt 251 lb (113.9 kg)   SpO2 97%   BMI 35.01 kg/m    Physical Exam Constitutional:      Appearance: He is well-developed.  HENT:     Head: Normocephalic and atraumatic.  Cardiovascular:     Rate and Rhythm: Normal rate and regular rhythm.     Heart sounds: Normal heart sounds.  Pulmonary:     Effort: Pulmonary effort is normal.     Breath sounds: Normal breath sounds.  Skin:    General: Skin is warm and dry.  Neurological:     Mental Status: He is alert and oriented to person, place, and time.  Psychiatric:        Behavior: Behavior normal.      Results for orders placed or performed in visit on 03/28/22  POCT glycosylated hemoglobin (Hb A1C)  Result Value Ref Range   Hemoglobin A1C 7.0 (A) 4.0 - 5.6 %   HbA1c POC (<> result, manual entry)     HbA1c, POC (prediabetic range)     HbA1c, POC (controlled diabetic range)        The 10-year ASCVD risk score (Arnett DK, et al., 2019) is: 41.1%    Assessment & Plan:   Problem List Items Addressed This Visit        Cardiovascular and Mediastinum   Essential hypertension, benign    Blood pressure looks great today.  He says at home he still staying mostly 140s and occasionally 150s with his home blood pressure cuff.  We will recheck it again today before he leaves.        Endocrine   Type 2 diabetes mellitus with hyperlipidemia (HCC)    A1c is up to 7.0 it looked great 6 months ago at 6.3.  He feels like overall he is doing pretty good with his diet we did discuss getting back into the gym he does not feel like he is necessarily less active than he was previously but has been thinking about joining the gym again.  Just encouraged him to continue to make those healthy food choices.  He would like to go up to a whole tab 3 times a day on his metformin which is reasonable just monitor for loose stools.  Follow-up in 3 to 4 months.      Relevant Medications   metFORMIN (GLUCOPHAGE) 1000 MG tablet   Other Relevant Orders   POCT glycosylated hemoglobin (Hb A1C) (Completed)   Diabetes  mellitus type 2 in obese ([place]) - Primary   Relevant Medications   metFORMIN (GLUCOPHAGE) 1000 MG tablet    Return in about 4 months (around 07/27/2022) for Diabetes follow-up.    Beatrice Lecher, MD

## 2022-03-28 NOTE — Assessment & Plan Note (Signed)
A1c is up to 7.0 it looked great 6 months ago at 6.3.  He feels like overall he is doing pretty good with his diet we did discuss getting back into the gym he does not feel like he is necessarily less active than he was previously but has been thinking about joining the gym again.  Just encouraged him to continue to make those healthy food choices.  He would like to go up to a whole tab 3 times a day on his metformin which is reasonable just monitor for loose stools.  Follow-up in 3 to 4 months.

## 2022-03-28 NOTE — Assessment & Plan Note (Signed)
Blood pressure looks great today.  He says at home he still staying mostly 140s and occasionally 150s with his home blood pressure cuff.  We will recheck it again today before he leaves.

## 2022-05-06 ENCOUNTER — Other Ambulatory Visit: Payer: Self-pay | Admitting: Family Medicine

## 2022-05-24 ENCOUNTER — Other Ambulatory Visit: Payer: Self-pay | Admitting: Family Medicine

## 2022-05-24 DIAGNOSIS — E1169 Type 2 diabetes mellitus with other specified complication: Secondary | ICD-10-CM

## 2022-06-02 ENCOUNTER — Other Ambulatory Visit: Payer: Self-pay | Admitting: Family Medicine

## 2022-06-02 DIAGNOSIS — E785 Hyperlipidemia, unspecified: Secondary | ICD-10-CM

## 2022-06-20 DIAGNOSIS — H26492 Other secondary cataract, left eye: Secondary | ICD-10-CM | POA: Diagnosis not present

## 2022-06-20 DIAGNOSIS — E113213 Type 2 diabetes mellitus with mild nonproliferative diabetic retinopathy with macular edema, bilateral: Secondary | ICD-10-CM | POA: Diagnosis not present

## 2022-06-20 DIAGNOSIS — Z961 Presence of intraocular lens: Secondary | ICD-10-CM | POA: Diagnosis not present

## 2022-06-20 LAB — HM DIABETES EYE EXAM

## 2022-06-30 ENCOUNTER — Other Ambulatory Visit: Payer: Self-pay | Admitting: Family Medicine

## 2022-07-13 ENCOUNTER — Encounter: Payer: Self-pay | Admitting: Family Medicine

## 2022-07-17 ENCOUNTER — Other Ambulatory Visit: Payer: Self-pay

## 2022-07-17 DIAGNOSIS — I10 Essential (primary) hypertension: Secondary | ICD-10-CM

## 2022-07-17 DIAGNOSIS — E1169 Type 2 diabetes mellitus with other specified complication: Secondary | ICD-10-CM

## 2022-07-17 DIAGNOSIS — E785 Hyperlipidemia, unspecified: Secondary | ICD-10-CM

## 2022-07-17 MED ORDER — OLMESARTAN-AMLODIPINE-HCTZ 40-5-25 MG PO TABS: 1.0000 | ORAL_TABLET | Freq: Every day | ORAL | 0 refills | Status: DC

## 2022-07-30 ENCOUNTER — Ambulatory Visit: Payer: Medicare Other | Admitting: Family Medicine

## 2022-07-31 ENCOUNTER — Encounter: Payer: Self-pay | Admitting: Family Medicine

## 2022-07-31 ENCOUNTER — Ambulatory Visit (INDEPENDENT_AMBULATORY_CARE_PROVIDER_SITE_OTHER): Payer: Medicare Other | Admitting: Family Medicine

## 2022-07-31 VITALS — BP 132/62 | HR 63 | Ht 71.0 in | Wt 254.0 lb

## 2022-07-31 DIAGNOSIS — I10 Essential (primary) hypertension: Secondary | ICD-10-CM | POA: Diagnosis not present

## 2022-07-31 DIAGNOSIS — D692 Other nonthrombocytopenic purpura: Secondary | ICD-10-CM | POA: Diagnosis not present

## 2022-07-31 DIAGNOSIS — E1169 Type 2 diabetes mellitus with other specified complication: Secondary | ICD-10-CM

## 2022-07-31 DIAGNOSIS — E785 Hyperlipidemia, unspecified: Secondary | ICD-10-CM

## 2022-07-31 DIAGNOSIS — E669 Obesity, unspecified: Secondary | ICD-10-CM

## 2022-07-31 LAB — POCT GLYCOSYLATED HEMOGLOBIN (HGB A1C): Hemoglobin A1C: 6.8 % — AB (ref 4.0–5.6)

## 2022-07-31 MED ORDER — OLMESARTAN-AMLODIPINE-HCTZ 40-10-25 MG PO TABS: 1.0000 | ORAL_TABLET | Freq: Every day | ORAL | 3 refills | Status: DC

## 2022-07-31 NOTE — Assessment & Plan Note (Signed)
A1C down to 6.8 which is awesome!!!! Continue to incorporate the exercise, it really seems to be helping.    Continue to work on [employer], sports.

## 2022-07-31 NOTE — Assessment & Plan Note (Signed)
Continue to work on healthy food choices regular exercise.

## 2022-07-31 NOTE — Progress Notes (Signed)
Established Patient Office Visit  Subjective   Patient ID: Aaron Horn, male    DOB: 1946/03/21  Age: 77 y.o. MRN: [id number]  Chief Complaint  Patient presents with   Diabetes   Hypertension    HPI   Hypertension- Pt denies chest pain, SOB, dizziness, or heart palpitations.  Taking meds as directed w/o problems.  Denies medication side effects.  He says ran out of his current med for about a weeka dn took some old  lisinopril and that seemed to reduce his BP better. Still getting 140 at home.    Diabetes - no hypoglycemic events. No wounds or sores that are not healing well. No increased thirst or urination. Checking glucose at home. Taking medications as prescribed without any side effects. Starting the hit the gym 1-3 days per week.      ROS    Objective:     BP 132/62   Pulse 63   Ht 5\' 11"  (1.803 m)   Wt 254 lb (115.2 kg)   SpO2 97%   BMI 35.43 kg/m    Physical Exam Constitutional:      Appearance: He is well-developed.  HENT:     Head: Normocephalic and atraumatic.  Cardiovascular:     Rate and Rhythm: Normal rate and regular rhythm.     Heart sounds: Normal heart sounds.  Pulmonary:     Effort: Pulmonary effort is normal.     Breath sounds: Normal breath sounds.  Skin:    General: Skin is warm and dry.  Neurological:     Mental Status: He is alert and oriented to person, place, and time.  Psychiatric:        Behavior: Behavior normal.      Results for orders placed or performed in visit on 07/31/22  POCT glycosylated hemoglobin (Hb A1C)  Result Value Ref Range   Hemoglobin A1C 6.8 (A) 4.0 - 5.6 %   HbA1c POC (<> result, manual entry)     HbA1c, POC (prediabetic range)     HbA1c, POC (controlled diabetic range)        The 10-year ASCVD risk score (Arnett DK, et al., 2019) is: 50.7%    Assessment & Plan:   Problem List Items Addressed This Visit       Cardiovascular and Mediastinum   Purpura senilis ([place])    Stable. No  changes      Relevant Medications   Olmesartan-amLODIPine-HCTZ (TRIBENZOR) 40-10-25 MG TABS   Essential hypertension, benign    Well controlled.  We did discuss going up slightly on the blood pressure pill we will increase the amlodipine component to 10 mg and still getting 140s and sometimes higher at home.  Plan to follow back up in 6 months.  Call if any problems or concerns.  Continue to work on healthy food choices low-salt diet and regular exercise.      Relevant Medications   Olmesartan-amLODIPine-HCTZ (TRIBENZOR) 40-10-25 MG TABS   Other Relevant Orders   CBC   Lipid Panel w/reflex Direct LDL     Endocrine   Type 2 diabetes mellitus with hyperlipidemia ([place]) - Primary    A1C down to 6.8 which is awesome!!!! Continue to incorporate the exercise, it really seems to be helping.    Continue to work on [employer], sports.        Relevant Medications   Olmesartan-amLODIPine-HCTZ (TRIBENZOR) 40-10-25 MG TABS   Other Relevant Orders   POCT glycosylated hemoglobin (Hb A1C) (Completed)   CBC  Lipid Panel w/reflex Direct LDL   Diabetes mellitus type 2 in obese ([place])    Continue to work on healthy food choices regular exercise.      Relevant Medications   Olmesartan-amLODIPine-HCTZ (TRIBENZOR) 40-10-25 MG TABS   Other Relevant Orders   CBC   Lipid Panel w/reflex Direct LDL    Return in about 3 months (around 11/07/2022) for Hypertension, Diabetes follow-up.    Beatrice Lecher, MD

## 2022-07-31 NOTE — Assessment & Plan Note (Addendum)
Well controlled.  We did discuss going up slightly on the blood pressure pill we will increase the amlodipine component to 10 mg and still getting 140s and sometimes higher at home.  Plan to follow back up in 6 months.  Call if any problems or concerns.  Continue to work on healthy food choices low-salt diet and regular exercise.

## 2022-07-31 NOTE — Assessment & Plan Note (Signed)
Stable No changes 

## 2022-08-01 DIAGNOSIS — E785 Hyperlipidemia, unspecified: Secondary | ICD-10-CM | POA: Diagnosis not present

## 2022-08-01 DIAGNOSIS — I10 Essential (primary) hypertension: Secondary | ICD-10-CM | POA: Diagnosis not present

## 2022-08-01 DIAGNOSIS — E1169 Type 2 diabetes mellitus with other specified complication: Secondary | ICD-10-CM | POA: Diagnosis not present

## 2022-08-02 LAB — LIPID PANEL W/REFLEX DIRECT LDL
Cholesterol: 162 mg/dL (ref <200)
HDL: 39 mg/dL — ABNORMAL LOW (ref >=40)
LDL Cholesterol (Calc): 89 mg/dL (calc)
Non-HDL Cholesterol (Calc): 123 mg/dL (calc) (ref <130)
Total CHOL/HDL Ratio: 4.2 (calc) (ref <5.0)
Triglycerides: 261 mg/dL — ABNORMAL HIGH (ref <150)

## 2022-08-02 LAB — CBC
HCT: 45.9 % (ref 38.5–50.0)
Hemoglobin: 15.4 g/dL (ref 13.2–17.1)
MCH: 31.4 pg (ref 27.0–33.0)
MCHC: 33.6 g/dL (ref 32.0–36.0)
MCV: 93.5 fL (ref 80.0–100.0)
MPV: 10.6 fL (ref 7.5–12.5)
Platelets: 223 10*3/uL (ref 140–400)
RBC: 4.91 10*6/uL (ref 4.20–5.80)
RDW: 13.7 % (ref 11.0–15.0)
WBC: 9.8 10*3/uL (ref 3.8–10.8)

## 2022-08-03 ENCOUNTER — Other Ambulatory Visit: Payer: Self-pay | Admitting: *Deleted

## 2022-08-03 MED ORDER — ATORVASTATIN CALCIUM 40 MG PO TABS: 40.0000 mg | ORAL_TABLET | Freq: Every day | ORAL | 3 refills | Status: DC

## 2022-08-03 NOTE — Progress Notes (Signed)
Have been, your triglycerides are still elevated similar to what they were before.  And your LDL cholesterol has gone up just slightly.  I would like to consider switching your simvastatin to atorvastatin.  It is a little bit more powerful to get your LDL and triglycerides down.  If you are okay with that then let me know and I will send over atorvastatin 40 mg to be taken at bedtime.  Blood Count is normal.

## 2022-08-27 ENCOUNTER — Other Ambulatory Visit: Payer: Self-pay | Admitting: Family Medicine

## 2022-08-29 ENCOUNTER — Other Ambulatory Visit: Payer: Self-pay | Admitting: Family Medicine

## 2022-08-29 DIAGNOSIS — E785 Hyperlipidemia, unspecified: Secondary | ICD-10-CM

## 2022-09-07 ENCOUNTER — Other Ambulatory Visit: Payer: Self-pay | Admitting: Family Medicine

## 2022-09-07 DIAGNOSIS — M1A09X1 Idiopathic chronic gout, multiple sites, with tophus (tophi): Secondary | ICD-10-CM

## 2022-09-18 ENCOUNTER — Telehealth: Payer: Self-pay | Admitting: General Practice

## 2022-09-18 NOTE — Telephone Encounter (Signed)
Contacted Aaron Horn to schedule their annual wellness visit. Patient declined to schedule AWV at this time.  Modesto Charon, RN BSN

## 2022-10-04 ENCOUNTER — Other Ambulatory Visit: Payer: Self-pay | Admitting: Physician Assistant

## 2022-11-07 ENCOUNTER — Ambulatory Visit (INDEPENDENT_AMBULATORY_CARE_PROVIDER_SITE_OTHER): Payer: Medicare Other | Admitting: Family Medicine

## 2022-11-07 ENCOUNTER — Encounter: Payer: Self-pay | Admitting: Family Medicine

## 2022-11-07 VITALS — BP 119/48 | HR 64 | Ht 71.0 in | Wt 250.0 lb

## 2022-11-07 DIAGNOSIS — E785 Hyperlipidemia, unspecified: Secondary | ICD-10-CM

## 2022-11-07 DIAGNOSIS — Z7984 Long term (current) use of oral hypoglycemic drugs: Secondary | ICD-10-CM

## 2022-11-07 DIAGNOSIS — R011 Cardiac murmur, unspecified: Secondary | ICD-10-CM

## 2022-11-07 DIAGNOSIS — E1169 Type 2 diabetes mellitus with other specified complication: Secondary | ICD-10-CM | POA: Diagnosis not present

## 2022-11-07 DIAGNOSIS — E669 Obesity, unspecified: Secondary | ICD-10-CM

## 2022-11-07 DIAGNOSIS — I10 Essential (primary) hypertension: Secondary | ICD-10-CM | POA: Diagnosis not present

## 2022-11-07 DIAGNOSIS — Z23 Encounter for immunization: Secondary | ICD-10-CM

## 2022-11-07 LAB — POCT GLYCOSYLATED HEMOGLOBIN (HGB A1C): Hemoglobin A1C: 6.6 % — AB (ref 4.0–5.6)

## 2022-11-07 NOTE — Assessment & Plan Note (Addendum)
A1c looks great today at 6.6.  Continue current regimen.  It is improving.  And really over the last 1 to 2 months he is put a lot of focus in on his diet and has been doing well.  Blood sugars have been running around 113 more consistently he did have 1 day where it dropped to the 70s.  We did discuss that if he has more episodes of low blood sugars then we will need to decrease his glipizide.  I really think he is made some fantastic changes.  He is also lost about 4 pounds which is fantastic.

## 2022-11-07 NOTE — Progress Notes (Signed)
Established Patient Office Visit  Subjective   Patient ID: Aaron Horn, male    DOB: Jun 13, 1945  Age: 77 y.o. MRN: [id number]  Chief Complaint  Patient presents with   Diabetes   Hypertension    HPI  Diabetes - no hypoglycemic events. No wounds or sores that are not healing well. No increased thirst or urination. Checking glucose at home. Taking medications as prescribed without any side effects.  Hypertension- Pt denies chest pain, SOB, dizziness, or heart palpitations.  Taking meds as directed w/o problems.  Denies medication side effects.       ROS    Objective:     BP (!) 119/48   Pulse 64   Ht 5\' 11"  (1.803 m)   Wt 250 lb (113.4 kg)   SpO2 96%   BMI 34.87 kg/m    Physical Exam Constitutional:      Appearance: He is well-developed.  HENT:     Head: Normocephalic and atraumatic.  Cardiovascular:     Rate and Rhythm: Normal rate and regular rhythm.     Heart sounds: Murmur heard.     Comments: 2/6 SEM Pulmonary:     Effort: Pulmonary effort is normal.     Breath sounds: Normal breath sounds.  Skin:    General: Skin is warm and dry.  Neurological:     Mental Status: He is alert and oriented to person, place, and time.  Psychiatric:        Behavior: Behavior normal.      Results for orders placed or performed in visit on 11/07/22  POCT glycosylated hemoglobin (Hb A1C)  Result Value Ref Range   Hemoglobin A1C 6.6 (A) 4.0 - 5.6 %   HbA1c POC (<> result, manual entry)     HbA1c, POC (prediabetic range)     HbA1c, POC (controlled diabetic range)        The 10-year ASCVD risk score (Arnett DK, et al., 2019) is: 45.2%    Assessment & Plan:   Problem List Items Addressed This Visit       Cardiovascular and Mediastinum   Essential hypertension, benign    Well controlled. Continue current regimen. Follow up in 4 months.         Endocrine   Type 2 diabetes mellitus with obesity (HCC)    Down 4 lbs with diet changes.  Doing very  well.      Type 2 diabetes mellitus with hyperlipidemia (HCC) - Primary    A1c looks great today at 6.6.  Continue current regimen.  It is improving.  And really over the last 1 to 2 months he is put a lot of focus in on his diet and has been doing well.  Blood sugars have been running around 113 more consistently he did have 1 day where it dropped to the 70s.  We did discuss that if he has more episodes of low blood sugars then we will need to decrease his glipizide.  I really think he is made some fantastic changes.  He is also lost about 4 pounds which is fantastic.      Relevant Orders   POCT glycosylated hemoglobin (Hb A1C) (Completed)     Other   Heart murmur   Dyslipidemia    Continue atorvastatin.  Last LDL was 89 in March.  Will continue to monitor.       Did Prevnar 20 today.    Return in about 4 months (around 03/09/2023) for Diabetes follow-up, Hypertension.  Beatrice Lecher, MD

## 2022-11-07 NOTE — Assessment & Plan Note (Signed)
Well controlled. Continue current regimen. Follow up in  4 months.   

## 2022-11-07 NOTE — Assessment & Plan Note (Signed)
Continue atorvastatin.  Last LDL was 89 in March.  Will continue to monitor.

## 2022-11-07 NOTE — Assessment & Plan Note (Signed)
Down 4 lbs with diet changes.  Doing very well.

## 2022-11-11 ENCOUNTER — Other Ambulatory Visit: Payer: Self-pay | Admitting: Family Medicine

## 2022-11-11 DIAGNOSIS — E1169 Type 2 diabetes mellitus with other specified complication: Secondary | ICD-10-CM

## 2022-11-23 ENCOUNTER — Other Ambulatory Visit: Payer: Self-pay | Admitting: Family Medicine

## 2022-11-23 ENCOUNTER — Other Ambulatory Visit: Payer: Self-pay

## 2022-11-23 DIAGNOSIS — E1169 Type 2 diabetes mellitus with other specified complication: Secondary | ICD-10-CM

## 2022-11-23 MED ORDER — ACCU-CHEK GUIDE W/DEVICE KIT
PACK | 99 refills | Status: AC
Start: 2022-11-23 — End: ?

## 2022-11-23 MED ORDER — ACCU-CHEK GUIDE VI STRP: ORAL_STRIP | 12 refills | Status: AC

## 2022-11-23 MED ORDER — LANCETS 30G MISC
99 refills | Status: AC
Start: 2022-11-23 — End: ?

## 2022-12-07 ENCOUNTER — Other Ambulatory Visit: Payer: Self-pay | Admitting: Family Medicine

## 2022-12-07 DIAGNOSIS — M1A09X1 Idiopathic chronic gout, multiple sites, with tophus (tophi): Secondary | ICD-10-CM

## 2023-02-09 ENCOUNTER — Other Ambulatory Visit: Payer: Self-pay | Admitting: Family Medicine

## 2023-02-09 DIAGNOSIS — E1169 Type 2 diabetes mellitus with other specified complication: Secondary | ICD-10-CM

## 2023-03-09 ENCOUNTER — Other Ambulatory Visit: Payer: Self-pay | Admitting: Family Medicine

## 2023-03-09 DIAGNOSIS — M1A09X1 Idiopathic chronic gout, multiple sites, with tophus (tophi): Secondary | ICD-10-CM

## 2023-03-14 ENCOUNTER — Encounter: Payer: Self-pay | Admitting: Family Medicine

## 2023-03-14 ENCOUNTER — Ambulatory Visit (INDEPENDENT_AMBULATORY_CARE_PROVIDER_SITE_OTHER): Payer: Medicare Other | Admitting: Family Medicine

## 2023-03-14 VITALS — BP 127/55 | HR 66 | Ht 71.0 in | Wt 255.0 lb

## 2023-03-14 DIAGNOSIS — E785 Hyperlipidemia, unspecified: Secondary | ICD-10-CM

## 2023-03-14 DIAGNOSIS — Z7984 Long term (current) use of oral hypoglycemic drugs: Secondary | ICD-10-CM

## 2023-03-14 DIAGNOSIS — L820 Inflamed seborrheic keratosis: Secondary | ICD-10-CM

## 2023-03-14 DIAGNOSIS — L821 Other seborrheic keratosis: Secondary | ICD-10-CM

## 2023-03-14 DIAGNOSIS — I10 Essential (primary) hypertension: Secondary | ICD-10-CM

## 2023-03-14 DIAGNOSIS — E1169 Type 2 diabetes mellitus with other specified complication: Secondary | ICD-10-CM

## 2023-03-14 DIAGNOSIS — E669 Obesity, unspecified: Secondary | ICD-10-CM

## 2023-03-14 LAB — POCT UA - MICROALBUMIN
Creatinine, POC: 100 mg/dL
Microalbumin Ur, POC: 80 mg/L

## 2023-03-14 LAB — POCT GLYCOSYLATED HEMOGLOBIN (HGB A1C): Hemoglobin A1C: 6.1 % — AB (ref 4.0–5.6)

## 2023-03-14 NOTE — Assessment & Plan Note (Signed)
Well controlled. Continue current regimen. Follow up in  6 mo  

## 2023-03-14 NOTE — Assessment & Plan Note (Addendum)
Continue to work on [employer] and staying active.  BMI is 35.

## 2023-03-14 NOTE — Assessment & Plan Note (Signed)
Well controlled. Continue current regimen. Follow up in  3-4 months.  We discussed decreasing his glipizide down when he is due for refill he still has another month or 2 of his current dose and wants to be able to finish that out first so encouraged him to give our office a call and we will try decreasing it he has been having a few hypoglycemic episodes particularly at night.

## 2023-03-14 NOTE — Assessment & Plan Note (Signed)
Continue to work on healthy diet and regular exercise. ?

## 2023-03-14 NOTE — Progress Notes (Signed)
Established Patient Office Visit  Subjective   Patient ID: Aaron Horn, male    DOB: Oct 07, 1945  Age: 77 y.o. MRN: [id number]  Chief Complaint  Patient presents with   Diabetes   Hypertension    HPI  Hypertension- Pt denies chest pain, SOB, dizziness, or heart palpitations.  Taking meds as directed w/o problems.  Denies medication side effects.    Diabetes -few hypoglycemic events particularly at night. No wounds or sores that are not healing well. No increased thirst or urination. Checking glucose at home. Taking medications as prescribed without any side effects.  Has lesion on he neck at collar he would like to have frozen today.     ROS    Objective:     BP (!) 127/55   Pulse 66   Ht 5\' 11"  (1.803 m)   Wt 255 lb (115.7 kg)   SpO2 96%   BMI 35.57 kg/m    Physical Exam Vitals and nursing note reviewed.  Constitutional:      Appearance: Normal appearance.  HENT:     Head: Normocephalic and atraumatic.  Eyes:     Conjunctiva/sclera: Conjunctivae normal.  Cardiovascular:     Rate and Rhythm: Normal rate and regular rhythm.     Heart sounds: Murmur heard.  Pulmonary:     Effort: Pulmonary effort is normal.     Breath sounds: Normal breath sounds.  Skin:    General: Skin is warm and dry.  Neurological:     Mental Status: He is alert.  Psychiatric:        Mood and Affect: Mood normal.      Results for orders placed or performed in visit on 03/14/23  POCT Urine microalbumin-creatinine with uACR  Result Value Ref Range   Microalbumin Ur, POC 80 mg/L   Creatinine, POC 100 mg/dL   Albumin/Creatinine Ratio, Urine, POC 30-300   POCT HgB A1C  Result Value Ref Range   Hemoglobin A1C 6.1 (A) 4.0 - 5.6 %   HbA1c POC (<> result, manual entry)     HbA1c, POC (prediabetic range)     HbA1c, POC (controlled diabetic range)        The 10-year ASCVD risk score (Arnett DK, et al., 2019) is: 51.4%    Assessment & Plan:   Problem List Items Addressed  This Visit       Cardiovascular and Mediastinum   Essential hypertension, benign    Well controlled. Continue current regimen. Follow up in  6 mo       Relevant Orders   POCT Urine microalbumin-creatinine with uACR (Completed)   CMP14+EGFR   Uric acid   POCT HgB A1C (Completed)     Endocrine   Type 2 diabetes mellitus with obesity (HCC)    Continue to work on healthy diet and staying active.  BMI is 35.      Type 2 diabetes mellitus with hyperlipidemia (HCC) - Primary    Well controlled. Continue current regimen. Follow up in  3-4 months.  We discussed decreasing his glipizide down when he is due for refill he still has another month or 2 of his current dose and wants to be able to finish that out first so encouraged him to give our office a call and we will try decreasing it he has been having a few hypoglycemic episodes particularly at night.      Relevant Orders   POCT Urine microalbumin-creatinine with uACR (Completed)   CMP14+EGFR   Uric acid  POCT HgB A1C (Completed)     Other   Severe obesity (BMI 35.0-39.9) with comorbidity (HCC)    Continue to work on healthy diet and regular exercise.       Other Visit Diagnoses     Seborrheic keratosis          Cryotherapy Procedure Note  Pre-operative Diagnosis: Seborrheic keratosis  Post-operative Diagnosis: same   Locations: anterior  neck   Indications: irritation at collar  Anesthesia: no needed.   Patient informed of risks (permanent scarring, infection, light or dark discoloration, bleeding, infection, weakness, numbness and recurrence of the lesion) and benefits of the procedure and verbal informed consent obtained.  The areas are treated with liquid nitrogen therapy, frozen until ice ball extended 1-2 mm beyond lesion, allowed to thaw, and treated again. The patient tolerated procedure well.  The patient was instructed on post-op care, warned that there may be blister formation, redness and pain. Recommend  OTC analgesia as needed for pain.  Condition: Stable  Complications: none.  Plan: F/U if any concerns regarding wound healing.    Return in about 4 months (around 07/12/2023) for DM/HTN.    Nani Gasser, MD

## 2023-03-15 LAB — CMP14+EGFR
ALT: 21 [IU]/L (ref 0–44)
AST: 18 [IU]/L (ref 0–40)
Albumin: 4.4 g/dL (ref 3.8–4.8)
Alkaline Phosphatase: 96 [IU]/L (ref 44–121)
BUN/Creatinine Ratio: 17 (ref 10–24)
BUN: 16 mg/dL (ref 8–27)
Bilirubin Total: 0.3 mg/dL (ref 0.0–1.2)
CO2: 23 mmol/L (ref 20–29)
Calcium: 9.6 mg/dL (ref 8.6–10.2)
Chloride: 100 mmol/L (ref 96–106)
Creatinine, Ser: 0.95 mg/dL (ref 0.76–1.27)
Globulin, Total: 2.2 g/dL (ref 1.5–4.5)
Glucose: 166 mg/dL — ABNORMAL HIGH (ref 70–99)
Potassium: 4.5 mmol/L (ref 3.5–5.2)
Sodium: 141 mmol/L (ref 134–144)
Total Protein: 6.6 g/dL (ref 6.0–8.5)
eGFR: 81 mL/min/{1.73_m2} (ref >59)

## 2023-03-15 LAB — URIC ACID: Uric Acid: 5.7 mg/dL (ref 3.8–8.4)

## 2023-03-15 NOTE — Progress Notes (Signed)
HI Rino, your metabolic panel and uric acid look good.

## 2023-05-09 ENCOUNTER — Other Ambulatory Visit: Payer: Self-pay | Admitting: Family Medicine

## 2023-05-09 DIAGNOSIS — E785 Hyperlipidemia, unspecified: Secondary | ICD-10-CM

## 2023-05-20 ENCOUNTER — Other Ambulatory Visit: Payer: Self-pay | Admitting: Family Medicine

## 2023-05-20 DIAGNOSIS — E1169 Type 2 diabetes mellitus with other specified complication: Secondary | ICD-10-CM

## 2023-06-08 ENCOUNTER — Other Ambulatory Visit: Payer: Self-pay | Admitting: Family Medicine

## 2023-06-08 DIAGNOSIS — M1A09X1 Idiopathic chronic gout, multiple sites, with tophus (tophi): Secondary | ICD-10-CM

## 2023-06-24 DIAGNOSIS — Z961 Presence of intraocular lens: Secondary | ICD-10-CM | POA: Diagnosis not present

## 2023-06-24 DIAGNOSIS — E113213 Type 2 diabetes mellitus with mild nonproliferative diabetic retinopathy with macular edema, bilateral: Secondary | ICD-10-CM | POA: Diagnosis not present

## 2023-06-24 LAB — HM DIABETES EYE EXAM

## 2023-07-11 ENCOUNTER — Encounter: Payer: Self-pay | Admitting: Family Medicine

## 2023-07-11 ENCOUNTER — Telehealth: Payer: Self-pay | Admitting: Family Medicine

## 2023-07-11 ENCOUNTER — Ambulatory Visit (INDEPENDENT_AMBULATORY_CARE_PROVIDER_SITE_OTHER): Payer: Medicare Other | Admitting: Family Medicine

## 2023-07-11 VITALS — BP 128/64 | HR 62 | Ht 71.0 in | Wt 258.0 lb

## 2023-07-11 DIAGNOSIS — E785 Hyperlipidemia, unspecified: Secondary | ICD-10-CM | POA: Diagnosis not present

## 2023-07-11 DIAGNOSIS — D692 Other nonthrombocytopenic purpura: Secondary | ICD-10-CM

## 2023-07-11 DIAGNOSIS — Z7984 Long term (current) use of oral hypoglycemic drugs: Secondary | ICD-10-CM

## 2023-07-11 DIAGNOSIS — M1A09X1 Idiopathic chronic gout, multiple sites, with tophus (tophi): Secondary | ICD-10-CM

## 2023-07-11 DIAGNOSIS — I10 Essential (primary) hypertension: Secondary | ICD-10-CM

## 2023-07-11 DIAGNOSIS — E1169 Type 2 diabetes mellitus with other specified complication: Secondary | ICD-10-CM

## 2023-07-11 DIAGNOSIS — R635 Abnormal weight gain: Secondary | ICD-10-CM

## 2023-07-11 DIAGNOSIS — Z6836 Body mass index (BMI) 36.0-36.9, adult: Secondary | ICD-10-CM

## 2023-07-11 LAB — POCT GLYCOSYLATED HEMOGLOBIN (HGB A1C): Hemoglobin A1C: 5.8 % — AB (ref 4.0–5.6)

## 2023-07-11 MED ORDER — TRULICITY 1.5 MG/0.5ML ~~LOC~~ SOAJ: 1.5000 mg | SUBCUTANEOUS | 0 refills | Status: DC

## 2023-07-11 NOTE — Assessment & Plan Note (Signed)
 Doing well. On allopurinol. Uric acid at goal.   Lab Results  Component Value Date   LABURIC 5.7 03/14/2023

## 2023-07-11 NOTE — Assessment & Plan Note (Signed)
 BP at goal today.

## 2023-07-11 NOTE — Assessment & Plan Note (Addendum)
 A1c looks fantastic at 5.8 today even better than in October when it was 6.1.  He says he is doing well he has had a few blood sugars in the 80s and even 70s in the evenings.  But he says he does not feel bad.  He is a little frustrated that he is gaining some weight though.

## 2023-07-11 NOTE — Progress Notes (Signed)
 Established Patient Office Visit  Subjective  Patient ID: Aaron Horn, male    DOB: 1946-02-16  Age: 78 y.o. MRN: [id number]  Chief Complaint  Patient presents with   Diabetes         HPI  He is doing well overall.  Not currently exercising but he feels like he does pretty good with his diet in regards to his blood sugars.  He has had a couple of blood sugars in the 80s and 70s but says he does not feel bad when it happens.  He would like to lose some weight he is frustrated that he is actually up 3 pounds.    ROS    Objective:     BP 128/64   Pulse 62   Ht 5\' 11"  (1.803 m)   Wt 258 lb (117 kg)   SpO2 98%   BMI 35.98 kg/m    Physical Exam Vitals and nursing note reviewed.  Constitutional:      Appearance: Normal appearance.  HENT:     Head: Normocephalic and atraumatic.  Eyes:     Conjunctiva/sclera: Conjunctivae normal.  Cardiovascular:     Rate and Rhythm: Normal rate and regular rhythm.  Pulmonary:     Effort: Pulmonary effort is normal.     Breath sounds: Normal breath sounds.  Skin:    General: Skin is warm and dry.  Neurological:     Mental Status: He is alert.  Psychiatric:        Mood and Affect: Mood normal.      Results for orders placed or performed in visit on 07/11/23  POCT HgB A1C  Result Value Ref Range   Hemoglobin A1C 5.8 (A) 4.0 - 5.6 %   HbA1c POC (<> result, manual entry)     HbA1c, POC (prediabetic range)     HbA1c, POC (controlled diabetic range)        The 10-year ASCVD risk score (Arnett DK, et al., 2019) is: 51.9%    Assessment & Plan:   Problem List Items Addressed This Visit       Cardiovascular and Mediastinum   Purpura senilis (HCC)   Brusing on both forearms.  Benign condition.       Essential hypertension, benign   BP at goal today.          Endocrine   Type 2 diabetes mellitus with hyperlipidemia (HCC) - Primary   A1c looks fantastic at 5.8 today even better than in October when it was 6.1.   He says he is doing well he has had a few blood sugars in the 80s and even 70s in the evenings.  But he says he does not feel bad.  He is a little frustrated that he is gaining some weight though.      Relevant Medications   Dulaglutide (TRULICITY) 1.5 MG/0.5ML SOAJ   Other Relevant Orders   POCT HgB A1C (Completed)     Other   Gout   Doing well. On allopurinol. Uric acid at goal.   Lab Results  Component Value Date   LABURIC 5.7 03/14/2023         Other Visit Diagnoses       Weight gain         BMI 36.0-36.9,adult           Weight gain/BMI 36-did encourage him to really get more active start walking again.  Doing some weights.  It is getting a little warmer outside so is easier  to get out.  I do think this would help.  We can look into seeing if insurance would cover Trulicity.  I think the Mounjaro and Ozempic are probably gena be cost prohibitive but if he does well with Trulicity then we can likely come off of the glipizide.  Continue to work on [employer] as well.  Is not currently on another GLP-1 and has never tried it before.  He is having a few low blood sugars on the glipizide and continues to gain weight.  Return in about 4 months (around 11/08/2023) for Diabetes follow-up, Hypertension.    Nani Gasser, MD

## 2023-07-11 NOTE — Assessment & Plan Note (Signed)
 Brusing on both forearms.  Benign condition.

## 2023-07-11 NOTE — Telephone Encounter (Signed)
 Trulicity needs PA

## 2023-07-12 NOTE — Telephone Encounter (Signed)
 Prior auth for: TRULICITY 1.5 MG Determination: Pending as of 07/12/23 Auth #: [id number] Valid from: n/a

## 2023-07-15 ENCOUNTER — Other Ambulatory Visit: Payer: Self-pay | Admitting: Family Medicine

## 2023-07-15 ENCOUNTER — Telehealth: Payer: Self-pay | Admitting: Family Medicine

## 2023-07-15 DIAGNOSIS — I10 Essential (primary) hypertension: Secondary | ICD-10-CM

## 2023-07-15 NOTE — Telephone Encounter (Signed)
 Pt reached out through call center and would like a call back. He did not want to give a reason for the call. Pt stated that it was between him and his provider. Pt was very upset when he was told that provider and cma were seeing patients and would have to call him back

## 2023-07-15 NOTE — Telephone Encounter (Signed)
Call patient and see what he needs.

## 2023-07-16 NOTE — Telephone Encounter (Signed)
 Prior auth for: TRULICITY 1.5 MG Determination: This medication or product is on your plan's list of covered drugs. Prior authorization is not required at this time. If your pharmacy has questions regarding the processing of your prescription, please have them call the [facility] pharmacy help desk at [phone]. Auth #: [id number] Valid from: n/a Patient & pharmacy notified via telephone

## 2023-07-16 NOTE — Telephone Encounter (Signed)
 Called pt and advised him to send a mychart message about what he would like to discuss.   Said that it would be the easiest way for him to get in touch with [place].

## 2023-07-17 ENCOUNTER — Other Ambulatory Visit: Payer: Self-pay

## 2023-07-17 ENCOUNTER — Other Ambulatory Visit: Payer: Self-pay | Admitting: Family Medicine

## 2023-07-17 DIAGNOSIS — I10 Essential (primary) hypertension: Secondary | ICD-10-CM

## 2023-07-17 MED ORDER — OLMESARTAN-AMLODIPINE-HCTZ 40-10-25 MG PO TABS: 1.0000 | ORAL_TABLET | Freq: Every day | ORAL | 3 refills | Status: DC

## 2023-07-17 MED ORDER — ATORVASTATIN CALCIUM 40 MG PO TABS: 40.0000 mg | ORAL_TABLET | Freq: Every day | ORAL | 3 refills | Status: DC

## 2023-07-17 NOTE — Telephone Encounter (Signed)
 Spoke with [employer] showing two medications being requested for refill  Olmesartan- amlodiine comb 40-10-25mg  Last written 07/31/2022  ( almost one year ago) Atorvastatin40 Last written 08/03/2022 ( almost one year ago)  Last Lipid panel 08/01/2022 Last OV 07/11/2023 Upcoming appt 11/07/2023

## 2023-07-17 NOTE — Telephone Encounter (Signed)
 Copied from CRM [id number]. Topic: Clinical - Prescription Issue >> Jul 17, 2023 11:03 AM Nila Nephew wrote: Reason for CRM: Patient's pharmacy is calling to request expedited medication be filled from request on 03/03. Patient is out of medication.

## 2023-08-13 ENCOUNTER — Other Ambulatory Visit: Payer: Self-pay | Admitting: Family Medicine

## 2023-08-13 DIAGNOSIS — E1169 Type 2 diabetes mellitus with other specified complication: Secondary | ICD-10-CM

## 2023-08-13 DIAGNOSIS — E785 Hyperlipidemia, unspecified: Secondary | ICD-10-CM

## 2023-08-15 ENCOUNTER — Other Ambulatory Visit: Payer: Self-pay | Admitting: Family Medicine

## 2023-08-15 DIAGNOSIS — E1169 Type 2 diabetes mellitus with other specified complication: Secondary | ICD-10-CM

## 2023-08-27 NOTE — Progress Notes (Signed)
 [facility] Quality Team Note  Name: Aaron Horn Date of Birth: 1945-05-30 MRN: [id number] Date: 08/27/2023  [facility] Quality Team has reviewed this patient's chart, please see recommendations below:  [facility] Quality Other; (Patient due for KED labs. Patient needs URINE Microalbumin/Creatinine Ratio as well as eGFR completed in 2025 for gap closure. Please make a note for upcoming appt with PCP 11/07/2023 to complete? Thanks for all you do!)

## 2023-09-06 ENCOUNTER — Other Ambulatory Visit: Payer: Self-pay | Admitting: Family Medicine

## 2023-09-06 DIAGNOSIS — M1A09X1 Idiopathic chronic gout, multiple sites, with tophus (tophi): Secondary | ICD-10-CM

## 2023-11-07 ENCOUNTER — Encounter: Payer: Self-pay | Admitting: Family Medicine

## 2023-11-07 ENCOUNTER — Ambulatory Visit (INDEPENDENT_AMBULATORY_CARE_PROVIDER_SITE_OTHER): Payer: Medicare Other | Admitting: Family Medicine

## 2023-11-07 VITALS — BP 128/78 | HR 60 | Ht 71.0 in | Wt 255.0 lb

## 2023-11-07 DIAGNOSIS — Z7984 Long term (current) use of oral hypoglycemic drugs: Secondary | ICD-10-CM

## 2023-11-07 DIAGNOSIS — D692 Other nonthrombocytopenic purpura: Secondary | ICD-10-CM

## 2023-11-07 DIAGNOSIS — E669 Obesity, unspecified: Secondary | ICD-10-CM

## 2023-11-07 DIAGNOSIS — E785 Hyperlipidemia, unspecified: Secondary | ICD-10-CM

## 2023-11-07 DIAGNOSIS — E1169 Type 2 diabetes mellitus with other specified complication: Secondary | ICD-10-CM

## 2023-11-07 DIAGNOSIS — I10 Essential (primary) hypertension: Secondary | ICD-10-CM

## 2023-11-07 LAB — POCT GLYCOSYLATED HEMOGLOBIN (HGB A1C): Hemoglobin A1C: 5.6 % (ref 4.0–5.6)

## 2023-11-07 MED ORDER — GLIPIZIDE ER 5 MG PO TB24: 5.0000 mg | ORAL_TABLET | Freq: Every day | ORAL | 1 refills | Status: DC

## 2023-11-07 NOTE — Assessment & Plan Note (Signed)
 Well controlled. Continue current regimen. Follow up in  6 mo

## 2023-11-07 NOTE — Assessment & Plan Note (Signed)
 Due for repeat lipoids panel.

## 2023-11-07 NOTE — Assessment & Plan Note (Signed)
 He would really like to lose more weight.

## 2023-11-07 NOTE — Assessment & Plan Note (Signed)
 Brusing on both post forearms today. Age related

## 2023-11-07 NOTE — Progress Notes (Signed)
 Established Patient Office Visit  Subjective  Patient ID: Aaron Horn, male    DOB: [birth year]  Age: 78 y.o. MRN: [id number]  Chief Complaint  Patient presents with   Diabetes   Hypertension    HPI  Diabetes - + hypoglycemic event, had a 71 last night but felt OK. No wounds or sores that are not healing well. No increased thirst or urination. Checking glucose at home. Taking medications as prescribed without any side effects.he hasn't been on the trulicity . He is frurstrated that he is not losing weight.   Hypertension- Pt denies chest pain, SOB, dizziness, or heart palpitations.  Taking meds as directed w/o problems.  Denies medication side effects.       ROS    Objective:     BP 128/78   Pulse 60   Ht 5' 11 (1.803 m)   Wt 255 lb (115.7 kg)   SpO2 96%   BMI 35.57 kg/m    Physical Exam Vitals and nursing note reviewed.  Constitutional:      Appearance: Normal appearance.  HENT:     Head: Normocephalic and atraumatic.   Eyes:     Conjunctiva/sclera: Conjunctivae normal.    Cardiovascular:     Rate and Rhythm: Normal rate and regular rhythm.     Heart sounds: Murmur heard.     Comments: 3/6 SEM Pulmonary:     Effort: Pulmonary effort is normal.     Breath sounds: Normal breath sounds.   Skin:    General: Skin is warm and dry.   Neurological:     Mental Status: He is alert.   Psychiatric:        Mood and Affect: Mood normal.      Results for orders placed or performed in visit on 11/07/23  POCT HgB A1C  Result Value Ref Range   Hemoglobin A1C 5.6 4.0 - 5.6 %   HbA1c POC (<> result, manual entry)     HbA1c, POC (prediabetic range)     HbA1c, POC (controlled diabetic range)        The 10-year ASCVD risk score (Arnett DK, et al., 2019) is: 51.9%    Assessment & Plan:   Problem List Items Addressed This Visit       Cardiovascular and Mediastinum   Purpura senilis (HCC)   Brusing on both post forearms today. Age related       Essential hypertension, benign   Well controlled. Continue current regimen. Follow up in  6 mo       Relevant Orders   CMP14+EGFR   Lipid panel   CBC   Urine Microalbumin w/creat. ratio     Endocrine   Type 2 diabetes mellitus with obesity (HCC)   He would really like to lose more weight.      Relevant Medications   glipiZIDE  (GLUCOTROL  XL) 5 MG 24 hr tablet   Type 2 diabetes mellitus with hyperlipidemia (HCC) - Primary   Has lost 3 lbs. Encouraged regular exercise, including resistance training.  A1c looks great.  In fact I am concerned that he may be having some lows he says his blood sugars usually higher in the morning than when he goes to bed and last night he had a 71.  We did discuss how glipizide  can increase risk for hypoglycemia especially as we get older.  Somata decrease his glipizide  from 10 mg down to 5 mg.  Continue with metformin .      Relevant Medications   glipiZIDE  (  GLUCOTROL  XL) 5 MG 24 hr tablet   Other Relevant Orders   POCT HgB A1C (Completed)   CMP14+EGFR   Lipid panel   CBC   Urine Microalbumin w/creat. ratio     Other   Severe obesity (BMI 35.0-39.9) with comorbidity (HCC)   Unfortunately copay was too high on trulicity . Do encourage him exercise as well.  He is down 3 lbs.       Relevant Medications   glipiZIDE  (GLUCOTROL  XL) 5 MG 24 hr tablet   Dyslipidemia   Due for repeat lipoids panel.        Return in about 4 months (around 03/08/2024) for Diabetes follow-up, Hypertension.    Dorothyann Byars, MD

## 2023-11-07 NOTE — Assessment & Plan Note (Signed)
 Has lost 3 lbs. Encouraged regular exercise, including resistance training.  A1c looks great.  In fact I am concerned that he may be having some lows he says his blood sugars usually higher in the morning than when he goes to bed and last night he had a 71.  We did discuss how glipizide  can increase risk for hypoglycemia especially as we get older.  Aaron Horn decrease his glipizide  from 10 mg down to 5 mg.  Continue with metformin .

## 2023-11-07 NOTE — Assessment & Plan Note (Signed)
 Unfortunately copay was too high on trulicity . Do encourage him exercise as well.  He is down 3 lbs.

## 2023-11-08 ENCOUNTER — Ambulatory Visit: Payer: Self-pay | Admitting: Family Medicine

## 2023-11-08 ENCOUNTER — Encounter: Payer: Self-pay | Admitting: Family Medicine

## 2023-11-08 LAB — LIPID PANEL
Chol/HDL Ratio: 4.6 ratio (ref 0.0–5.0)
Cholesterol, Total: 151 mg/dL (ref 100–199)
HDL: 33 mg/dL — ABNORMAL LOW (ref >39)
LDL Chol Calc (NIH): 86 mg/dL (ref 0–99)
Triglycerides: 185 mg/dL — ABNORMAL HIGH (ref 0–149)
VLDL Cholesterol Cal: 32 mg/dL (ref 5–40)

## 2023-11-08 LAB — CMP14+EGFR
ALT: 25 IU/L (ref 0–44)
AST: 21 IU/L (ref 0–40)
Albumin: 4.4 g/dL (ref 3.8–4.8)
Alkaline Phosphatase: 88 IU/L (ref 44–121)
BUN/Creatinine Ratio: 19 (ref 10–24)
BUN: 19 mg/dL (ref 8–27)
Bilirubin Total: 0.4 mg/dL (ref 0.0–1.2)
CO2: 22 mmol/L (ref 20–29)
Calcium: 9.3 mg/dL (ref 8.6–10.2)
Chloride: 102 mmol/L (ref 96–106)
Creatinine, Ser: 1 mg/dL (ref 0.76–1.27)
Globulin, Total: 2.4 g/dL (ref 1.5–4.5)
Glucose: 104 mg/dL — ABNORMAL HIGH (ref 70–99)
Potassium: 4.4 mmol/L (ref 3.5–5.2)
Sodium: 142 mmol/L (ref 134–144)
Total Protein: 6.8 g/dL (ref 6.0–8.5)
eGFR: 77 mL/min/{1.73_m2} (ref >59)

## 2023-11-08 LAB — CBC
Hematocrit: 42 % (ref 37.5–51.0)
Hemoglobin: 13.6 g/dL (ref 13.0–17.7)
MCH: 31.1 pg (ref 26.6–33.0)
MCHC: 32.4 g/dL (ref 31.5–35.7)
MCV: 96 fL (ref 79–97)
Platelets: 228 10*3/uL (ref 150–450)
RBC: 4.38 x10E6/uL (ref 4.14–5.80)
RDW: 14.2 % (ref 11.6–15.4)
WBC: 8.9 10*3/uL (ref 3.4–10.8)

## 2023-11-08 LAB — MICROALBUMIN / CREATININE URINE RATIO
Creatinine, Urine: 90.4 mg/dL
Microalb/Creat Ratio: 89 mg/g{creat} — ABNORMAL HIGH (ref 0–29)
Microalbumin, Urine: 80.5 ug/mL

## 2023-11-08 NOTE — Progress Notes (Signed)
 Hi Aaron Horn, LDL cholesterol looks pretty good.  Please continue with the Lipitor.  Triglycerides are still high but they are much better than they have been the last several times so that is great.  You are spilling a little extra protein in your urine which can show that the kidneys are showing a little stress.  But kidney function is stable so that is good.  Will keep an eye on this.  Blood count is normal.  Similar for you to go ahead and schedule your telephone call with Jon our Medicare wellness nurse so that we can do your Medicare wellness intake for this year.

## 2023-11-09 ENCOUNTER — Other Ambulatory Visit: Payer: Self-pay | Admitting: Family Medicine

## 2023-11-09 DIAGNOSIS — E669 Obesity, unspecified: Secondary | ICD-10-CM

## 2023-12-05 ENCOUNTER — Other Ambulatory Visit: Payer: Self-pay | Admitting: Family Medicine

## 2023-12-05 DIAGNOSIS — M1A09X1 Idiopathic chronic gout, multiple sites, with tophus (tophi): Secondary | ICD-10-CM

## 2024-02-07 ENCOUNTER — Other Ambulatory Visit: Payer: Self-pay | Admitting: Family Medicine

## 2024-02-07 DIAGNOSIS — E1169 Type 2 diabetes mellitus with other specified complication: Secondary | ICD-10-CM

## 2024-03-07 ENCOUNTER — Other Ambulatory Visit: Payer: Self-pay | Admitting: Family Medicine

## 2024-03-07 DIAGNOSIS — M1A09X1 Idiopathic chronic gout, multiple sites, with tophus (tophi): Secondary | ICD-10-CM

## 2024-03-09 ENCOUNTER — Encounter: Payer: Self-pay | Admitting: Family Medicine

## 2024-03-09 ENCOUNTER — Ambulatory Visit: Admitting: Family Medicine

## 2024-03-09 VITALS — BP 128/63 | HR 60 | Ht 71.0 in | Wt 249.0 lb

## 2024-03-09 DIAGNOSIS — E785 Hyperlipidemia, unspecified: Secondary | ICD-10-CM | POA: Diagnosis not present

## 2024-03-09 DIAGNOSIS — M25561 Pain in right knee: Secondary | ICD-10-CM | POA: Insufficient documentation

## 2024-03-09 DIAGNOSIS — Z7984 Long term (current) use of oral hypoglycemic drugs: Secondary | ICD-10-CM

## 2024-03-09 DIAGNOSIS — I10 Essential (primary) hypertension: Secondary | ICD-10-CM | POA: Diagnosis not present

## 2024-03-09 DIAGNOSIS — E1169 Type 2 diabetes mellitus with other specified complication: Secondary | ICD-10-CM | POA: Diagnosis not present

## 2024-03-09 LAB — POCT GLYCOSYLATED HEMOGLOBIN (HGB A1C): Hemoglobin A1C: 5.7 % — AB (ref 4.0–5.6)

## 2024-03-09 NOTE — Assessment & Plan Note (Addendum)
 A1C looks good today at 5.7% Slightly elevated from eating not the best for birthday.  Labs are UTD Yearly eye exam & foot exam UTD.  Continue Glipizide , Metformin , and Fish Oil  Watch for any blood glucose levels lower than 80   F/u in 4 months.

## 2024-03-09 NOTE — Assessment & Plan Note (Signed)
 BP looks good today Continue Tribenzor for HTN management

## 2024-03-09 NOTE — Progress Notes (Signed)
   Established Patient Office Visit  Patient ID: Aaron Horn, male    DOB: 1946/04/08  Age: 78 y.o. MRN: [id number] PCP: Alvan Dorothyann BIRCH, MD   See note above.

## 2024-03-09 NOTE — Assessment & Plan Note (Addendum)
 Will continue to watch for increased pain or worsening symptoms XR placed as needed for worsening symptoms

## 2024-03-09 NOTE — Progress Notes (Signed)
 Established Patient Office Visit  Subjective   Patient ID: Aaron Horn, male    DOB: 23-Mar-1946  Age: 78 y.o. MRN: [id number]  Chief Complaint  Patient presents with   Diabetes   Hypertension    Patient is an 78 yo male with T2DM, HTN, and dyslipidemia presenting for a 6 month follow up for health management.   Type 2 Diabetes Mellitus  - At home blood sugars are ranging from 90s to 140s  - Diet with smaller portion size including mostly vegetables and protein.  - No adverse effects with current medication regimen   HTN - BP has been stable  - Taking medication as prescribed without issue - Admits to lack of exercise   Dyslipidemia  - Maintaining healthy diet with some starch added  - No reported adverse effects from Statin     Review of Systems  Constitutional:  Negative for malaise/fatigue and weight loss.  Cardiovascular:  Negative for chest pain and palpitations.  Gastrointestinal:  Negative for abdominal pain, constipation, diarrhea, nausea and vomiting.  Genitourinary:  Negative for dysuria, frequency and urgency.  Musculoskeletal:        Patient complains of intermittent pain in the right knee.   All other systems reviewed and are negative.     Objective:     BP 128/63   Pulse 60   Ht 5' 11 (1.803 m)   Wt 113 kg   SpO2 97%   BMI 34.73 kg/m    Physical Exam Constitutional:      Appearance: Normal appearance. He is obese.  HENT:     Head: Normocephalic and atraumatic.  Cardiovascular:     Rate and Rhythm: Normal rate and regular rhythm.     Heart sounds: Normal heart sounds. No murmur heard.    No friction rub. No gallop.  Pulmonary:     Effort: Pulmonary effort is normal. No respiratory distress.     Breath sounds: Normal breath sounds. No wheezing or rales.  Musculoskeletal:     Right knee: Normal. No bony tenderness or crepitus. No tenderness.     Left knee: Normal. No bony tenderness or crepitus. No tenderness.     Comments: Pt  states pain is intermittent and feeling okay during today's exam.   Neurological:     Mental Status: He is alert.      Results for orders placed or performed in visit on 03/09/24  POCT HgB A1C  Result Value Ref Range   Hemoglobin A1C 5.7 (A) 4.0 - 5.6 %   HbA1c POC (<> result, manual entry)     HbA1c, POC (prediabetic range)     HbA1c, POC (controlled diabetic range)        The ASCVD Risk score (Arnett DK, et al., 2019) failed to calculate for the following reasons:   The 2019 ASCVD risk score is only valid for ages 14 to 74    Assessment & Plan:   Problem List Items Addressed This Visit       Cardiovascular and Mediastinum   Essential hypertension, benign   BP looks good today Continue Tribenzor for HTN management        Relevant Orders   POCT HgB A1C (Completed)     Endocrine   Type 2 diabetes mellitus with hyperlipidemia (HCC) - Primary   A1C looks good today at 5.7% Slightly elevated from eating not the best for birthday.  Labs are UTD Yearly eye exam & foot exam UTD.  Continue Glipizide , Metformin , and  Fish Oil  Watch for any blood glucose levels lower than 80   F/u in 4 months.       Relevant Orders   POCT HgB A1C (Completed)     Other   Acute pain of right knee   Will continue to watch for increased pain or worsening symptoms XR placed as needed for worsening symptoms       Return in about 4 months (around 07/10/2024) for Diabetes follow-up, Hypertension.    Jourdain Guay K Stephaun Million, Student-PA

## 2024-04-23 ENCOUNTER — Other Ambulatory Visit: Payer: Self-pay | Admitting: Family Medicine

## 2024-04-23 DIAGNOSIS — E785 Hyperlipidemia, unspecified: Secondary | ICD-10-CM

## 2024-04-29 ENCOUNTER — Other Ambulatory Visit: Payer: Self-pay | Admitting: Family Medicine

## 2024-04-29 DIAGNOSIS — E1169 Type 2 diabetes mellitus with other specified complication: Secondary | ICD-10-CM

## 2024-05-10 ENCOUNTER — Other Ambulatory Visit: Payer: Self-pay | Admitting: Family Medicine

## 2024-05-10 DIAGNOSIS — E669 Obesity, unspecified: Secondary | ICD-10-CM

## 2024-05-11 ENCOUNTER — Other Ambulatory Visit (HOSPITAL_COMMUNITY): Payer: Self-pay

## 2024-05-11 ENCOUNTER — Telehealth: Payer: Self-pay

## 2024-05-11 DIAGNOSIS — E669 Obesity, unspecified: Secondary | ICD-10-CM

## 2024-05-11 NOTE — Telephone Encounter (Signed)
 Pharmacy Patient Advocate Encounter   Received notification from CoverMyMeds that prior authorization for Metformin  1000mg  tabs is required/requested.   Insurance verification completed.   The patient is insured through [employer].   Per test claim: PA required; PA started via CoverMyMeds. KEY [id number] . Waiting for clinical questions to populate.

## 2024-05-12 NOTE — Telephone Encounter (Signed)
 Clinical questions answered and PA submitted.

## 2024-05-13 NOTE — Telephone Encounter (Signed)
 Pharmacy Patient Advocate Encounter  Received notification from [facility] that Prior Authorization for Metformin  1000mg  tabs has been DENIED.  See denial reason below. No denial letter attached in CMM. Will attach denial letter to Media tab once received.   PA #/Case ID/Reference #: [id number]

## 2024-05-15 MED ORDER — METFORMIN HCL 1000 MG PO TABS: 1000.0000 mg | ORAL_TABLET | Freq: Two times a day (BID) | ORAL | 1 refills | Status: AC

## 2024-05-15 NOTE — Telephone Encounter (Signed)
 Spoke with patient.  States that he had just received a call from [facility] to pick up the prescription as qty # [phone] and this was around the same price he always pays for this . When he picks this up he will let  us  know .

## 2024-05-15 NOTE — Telephone Encounter (Signed)
 Okay, please let patient know that the metformin  is only covered for twice a day so just decrease down to taking it before breakfast and evening meal and not taking it 3 times a day.  New prescription sent and updated to [facility].

## 2024-05-15 NOTE — Telephone Encounter (Signed)
 Spoke with patient . He would like to stay with TID dosing as he thinks his A1C would be a lot worse at his next testing due to overindulgence at the holidays.

## 2024-05-15 NOTE — Telephone Encounter (Signed)
 Honestly his last A1c was so great he could probably drop down to twice a day and still do really well.  So if he wants to try it until I see him back for his next A1c we can see if it still looks great.

## 2024-05-15 NOTE — Telephone Encounter (Signed)
 Called [facility] pharmacy and verified that this is correct - a refill as TID dosing is costing the patient around $18. States the pharmacy put this on  a [facility] discount card - he states this should be able to continue at this price or very close to it through out this year at that price .  Should I cancel the BID dosing that was sent and resend the TID dosing to the pharmacy for refills. ?

## 2024-05-15 NOTE — Addendum Note (Signed)
 Addended by: Marnie Fazzino D on: 05/15/2024 10:47 AM   Modules accepted: Orders

## 2024-05-18 ENCOUNTER — Other Ambulatory Visit (HOSPITAL_COMMUNITY): Payer: Self-pay

## 2024-06-06 ENCOUNTER — Other Ambulatory Visit: Payer: Self-pay | Admitting: Family Medicine

## 2024-06-06 DIAGNOSIS — M1A09X1 Idiopathic chronic gout, multiple sites, with tophus (tophi): Secondary | ICD-10-CM

## 2024-06-19 ENCOUNTER — Other Ambulatory Visit: Payer: Self-pay | Admitting: Family Medicine

## 2024-06-19 DIAGNOSIS — I10 Essential (primary) hypertension: Secondary | ICD-10-CM

## 2024-07-13 ENCOUNTER — Ambulatory Visit: Admitting: Family Medicine
